# Patient Record
Sex: Female | Born: 1985
Health system: Southern US, Community
[De-identification: ages and names within clinical notes are randomized; demographics above are authoritative.]

## PROBLEM LIST (undated history)

## (undated) DIAGNOSIS — A6000 Herpesviral infection of urogenital system, unspecified: Secondary | ICD-10-CM

## (undated) DIAGNOSIS — R87612 Low grade squamous intraepithelial lesion on cytologic smear of cervix (LGSIL): Secondary | ICD-10-CM

## (undated) DIAGNOSIS — N3090 Cystitis, unspecified without hematuria: Secondary | ICD-10-CM

## (undated) DIAGNOSIS — G43909 Migraine, unspecified, not intractable, without status migrainosus: Secondary | ICD-10-CM

## (undated) HISTORY — DX: Low grade squamous intraepithelial lesion on cytologic smear of cervix (LGSIL): R87.612

## (undated) HISTORY — DX: Herpesviral infection of urogenital system, unspecified: A60.00

## (undated) HISTORY — PX: MOUTH SURGERY: SHX715

## (undated) HISTORY — DX: Migraine, unspecified, not intractable, without status migrainosus: G43.909

## (undated) HISTORY — DX: Cystitis, unspecified without hematuria: N30.90

## (undated) HISTORY — PX: HIP SURGERY: SHX245

---

## 2007-06-30 ENCOUNTER — Ambulatory Visit: Payer: Self-pay | Admitting: Urology

## 2007-07-01 ENCOUNTER — Ambulatory Visit: Payer: Self-pay | Admitting: Urology

## 2008-08-29 IMAGING — US US RENAL KIDNEY
1 series · 17 of 23 positions shown · non-contrast
Comparison: none

REASON FOR EXAM: Chronic Cystitis
COMMENTS:

[Series 1: us renal kidney · 17 of 23 slices shown]
[im 1/23]
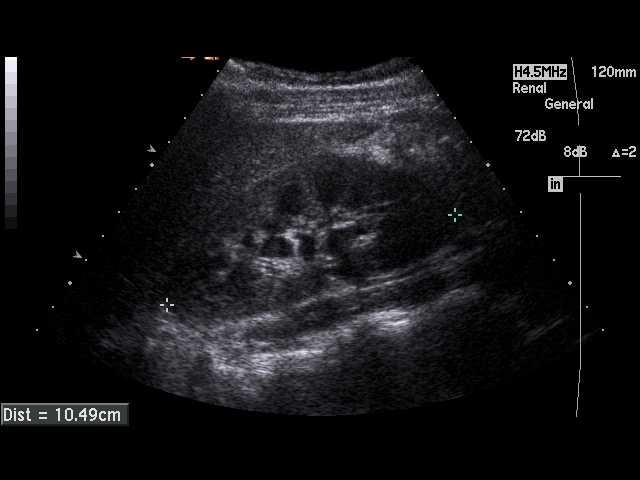
[im 3/23]
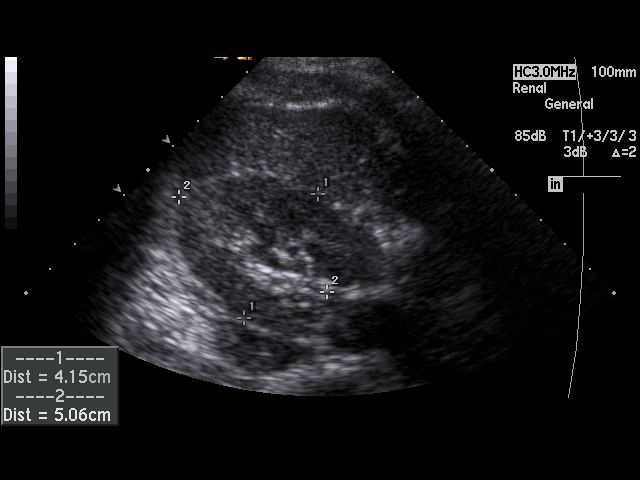
[im 4/23]
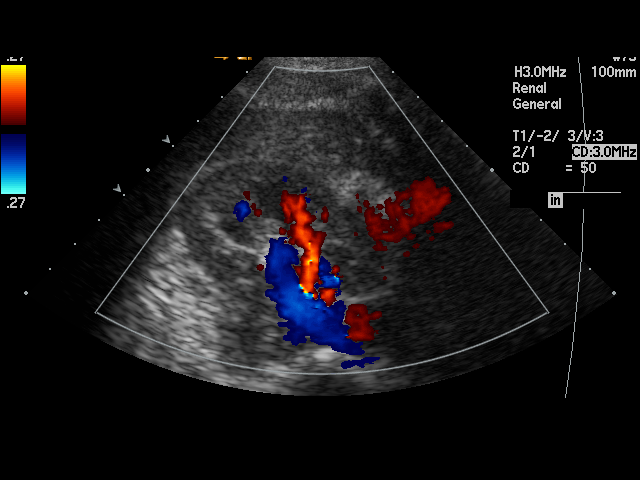
[im 5/23]
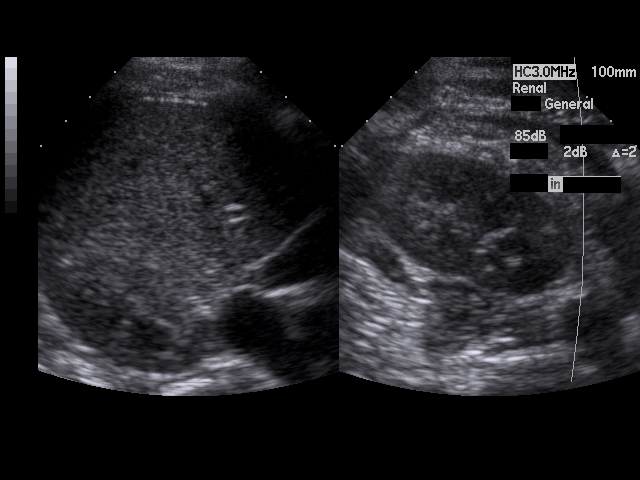
[im 7/23]
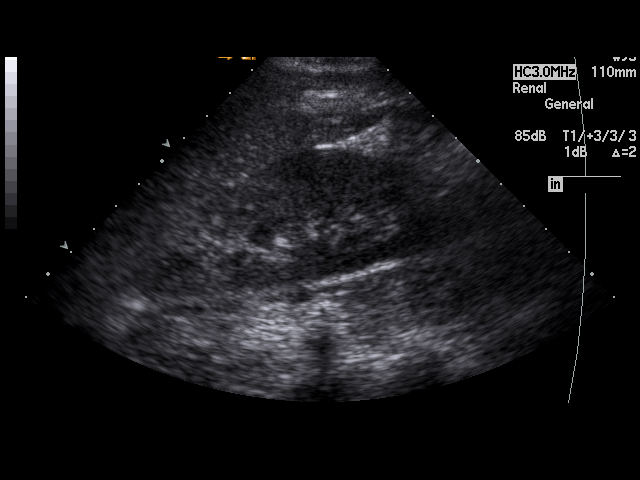
[im 8/23]
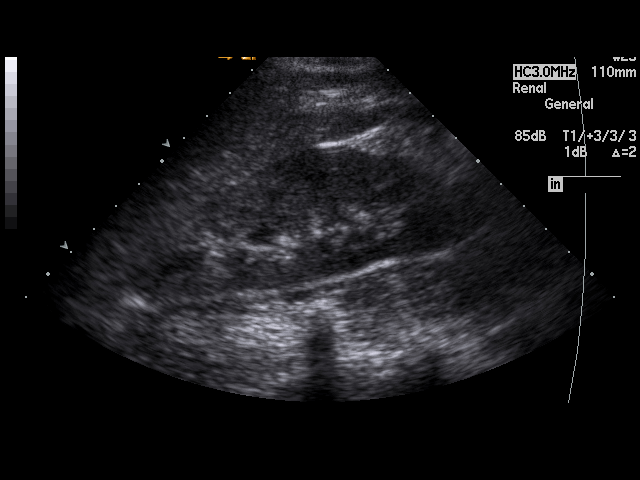
[im 9/23]
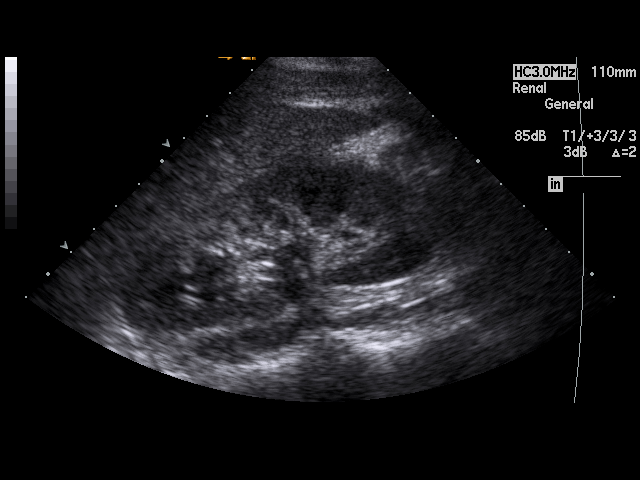
[im 11/23]
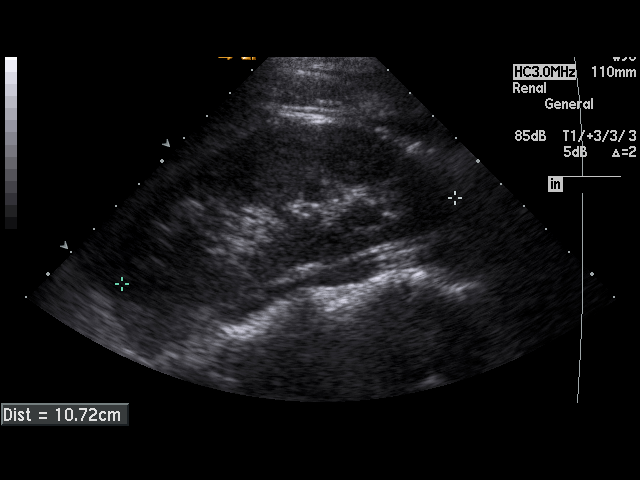
[im 12/23]
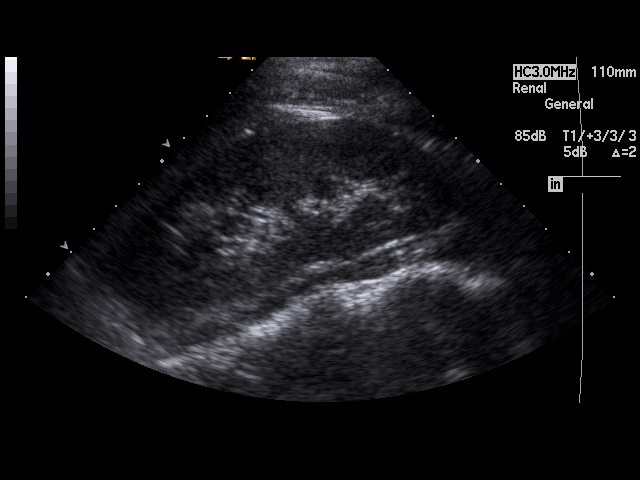
[im 13/23]
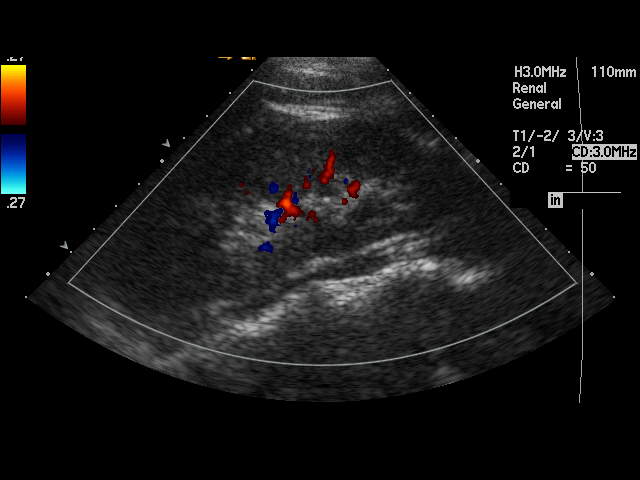
[im 15/23]
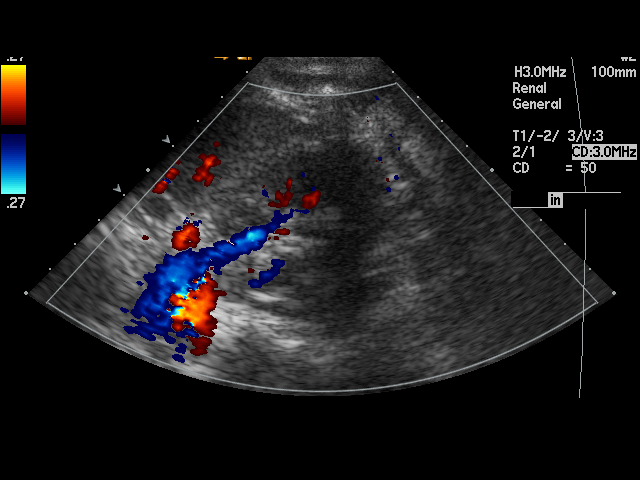
[im 16/23]
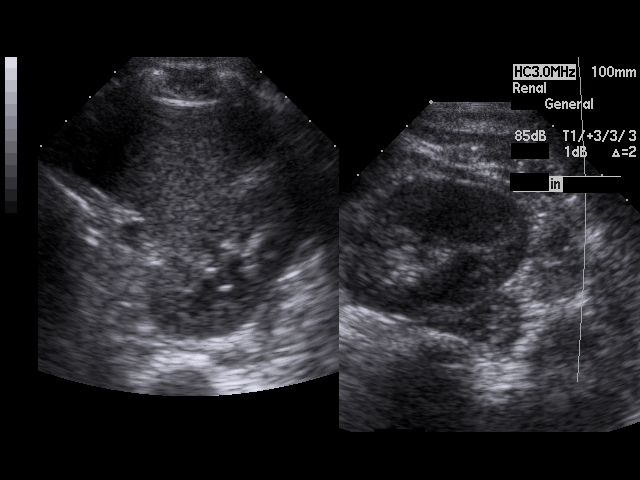
[im 17/23]
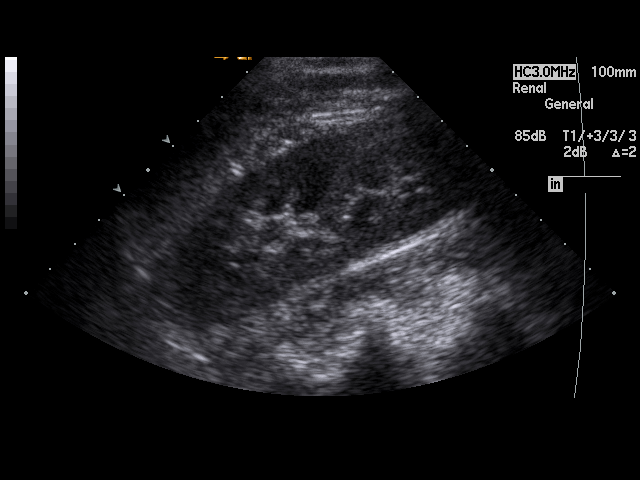
[im 19/23]
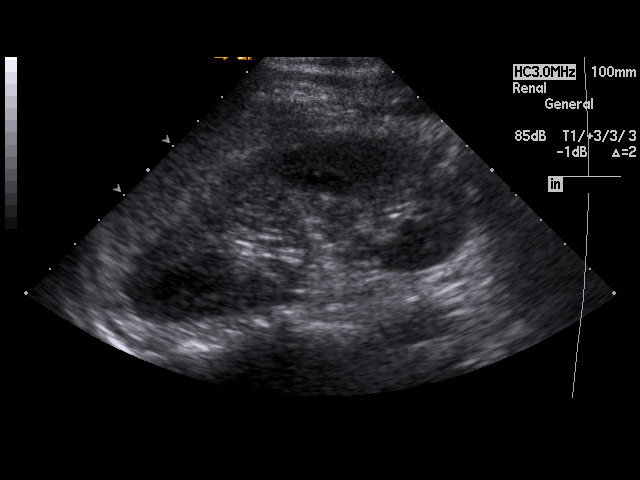
[im 20/23]
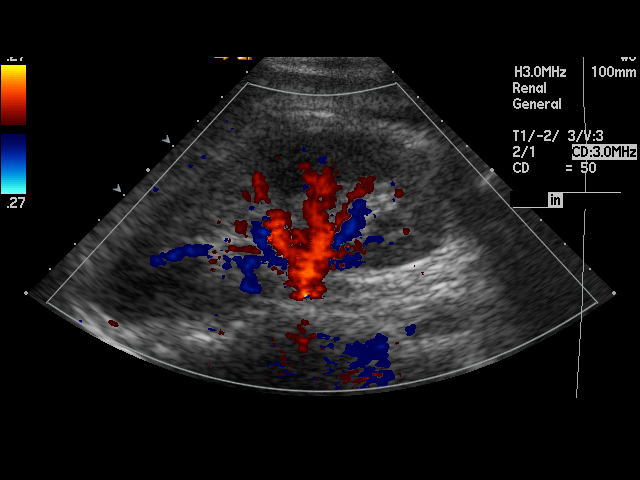
[im 21/23]
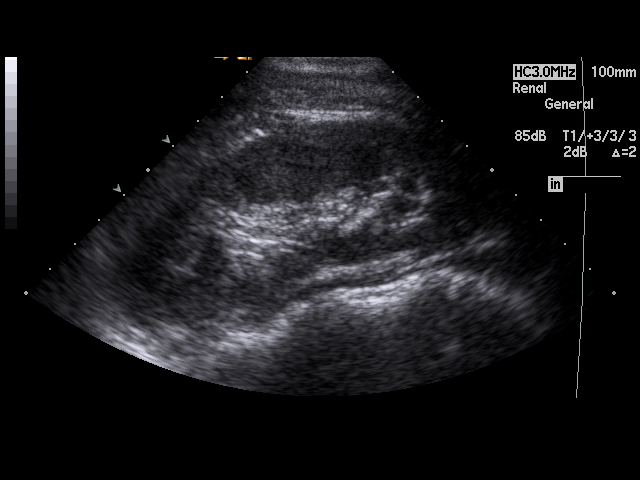
[im 23/23]
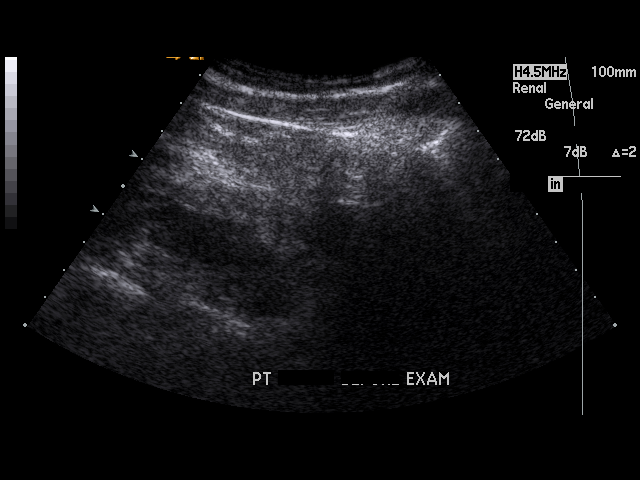

[17 of 23 positions shown; findings below may reference images not displayed]

PROCEDURE:     US  - US KIDNEY  - June 30, 2007 [DATE]

RESULT:     The RIGHT kidney measures 10.49 cm x 4.15 cm x 5.06 cm, and the
LEFT kidney measures 10.72 cm x 3.77 cm x 4.48 cm.  The renal cortical
margins appear smooth. No renal mass lesions are seen. No renal
calcifications are identified. There is no hydronephrosis. The urinary
bladder is empty and therefore not visualized sufficiently for evaluation on
this exam.
IMPRESSION: No significant renal abnormalities are identified.

## 2008-08-29 IMAGING — CR DG ABDOMEN 1V
1 series · 2 of 2 positions shown · non-contrast
Comparison: none

REASON FOR EXAM: [DATE] Other Chronic Cystitis-SEND FILMS WITH PATIENT
COMMENTS:

[Series 1: view not recorded · 0.17mm/px · 2 of 2 slices shown]
[im 1/2]
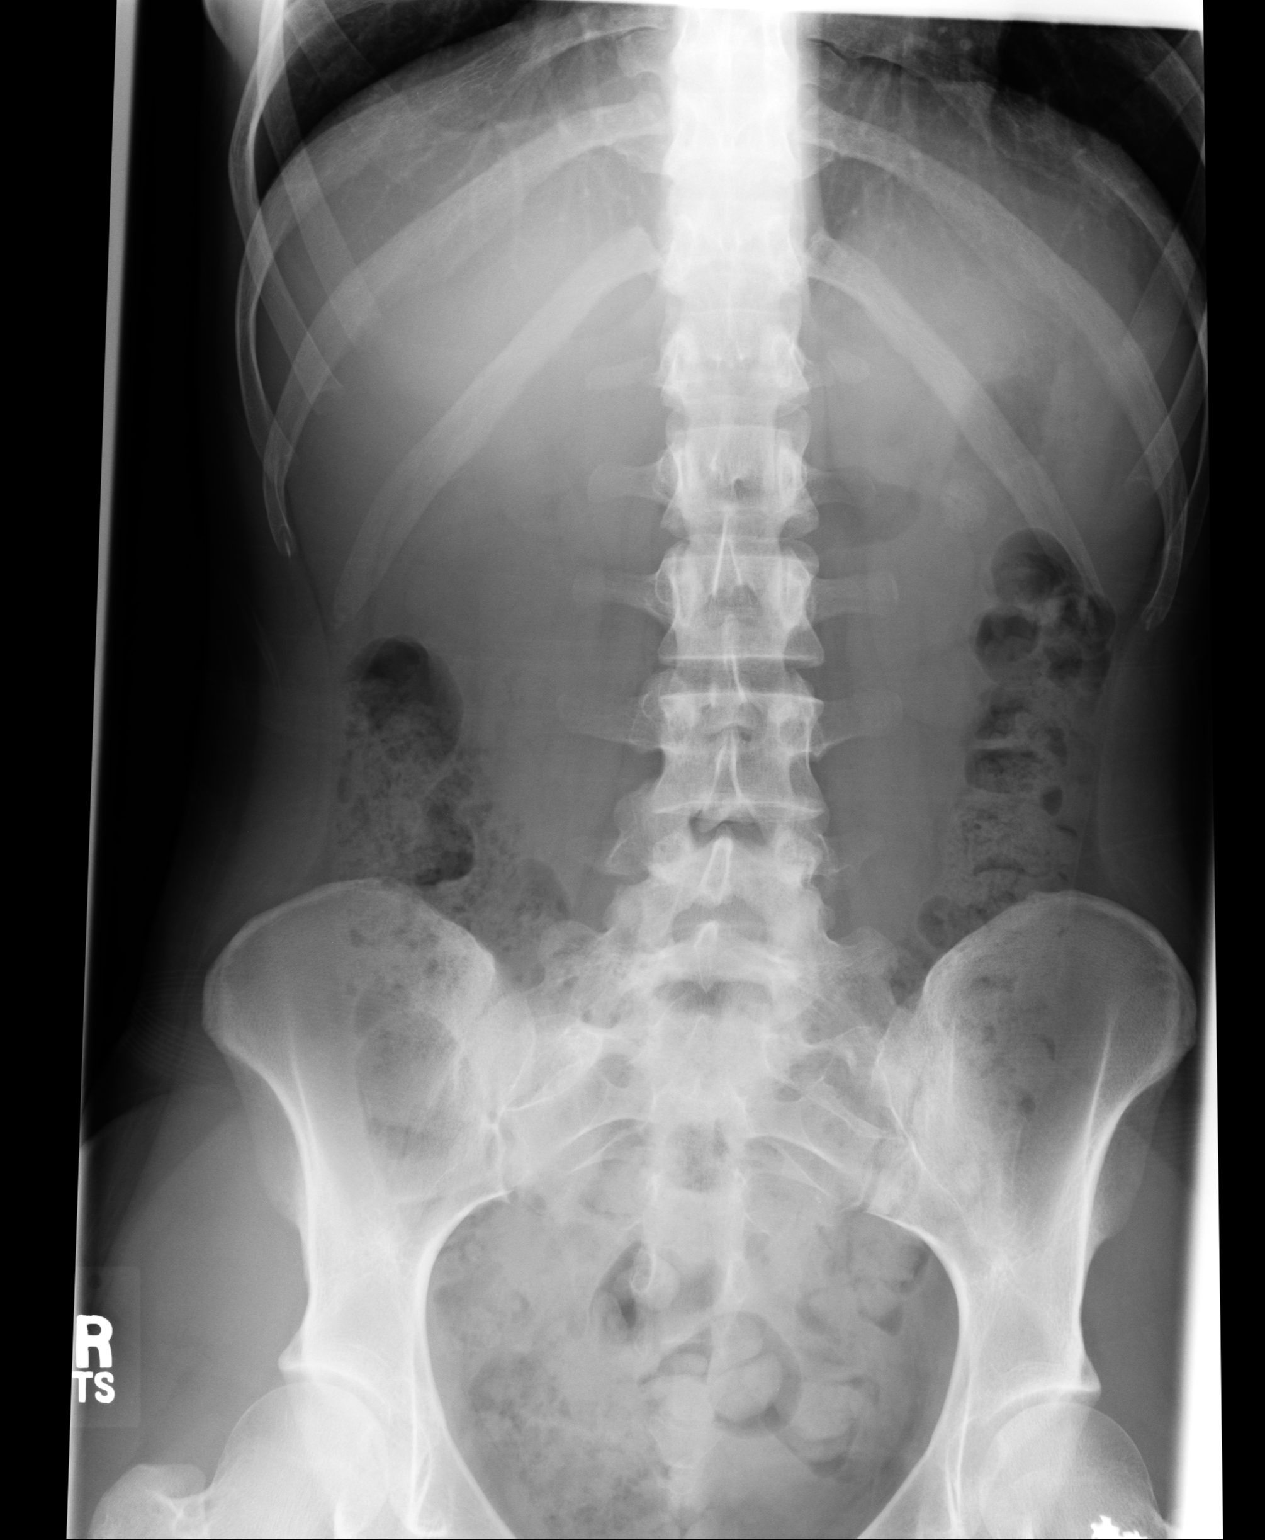
[im 2/2]
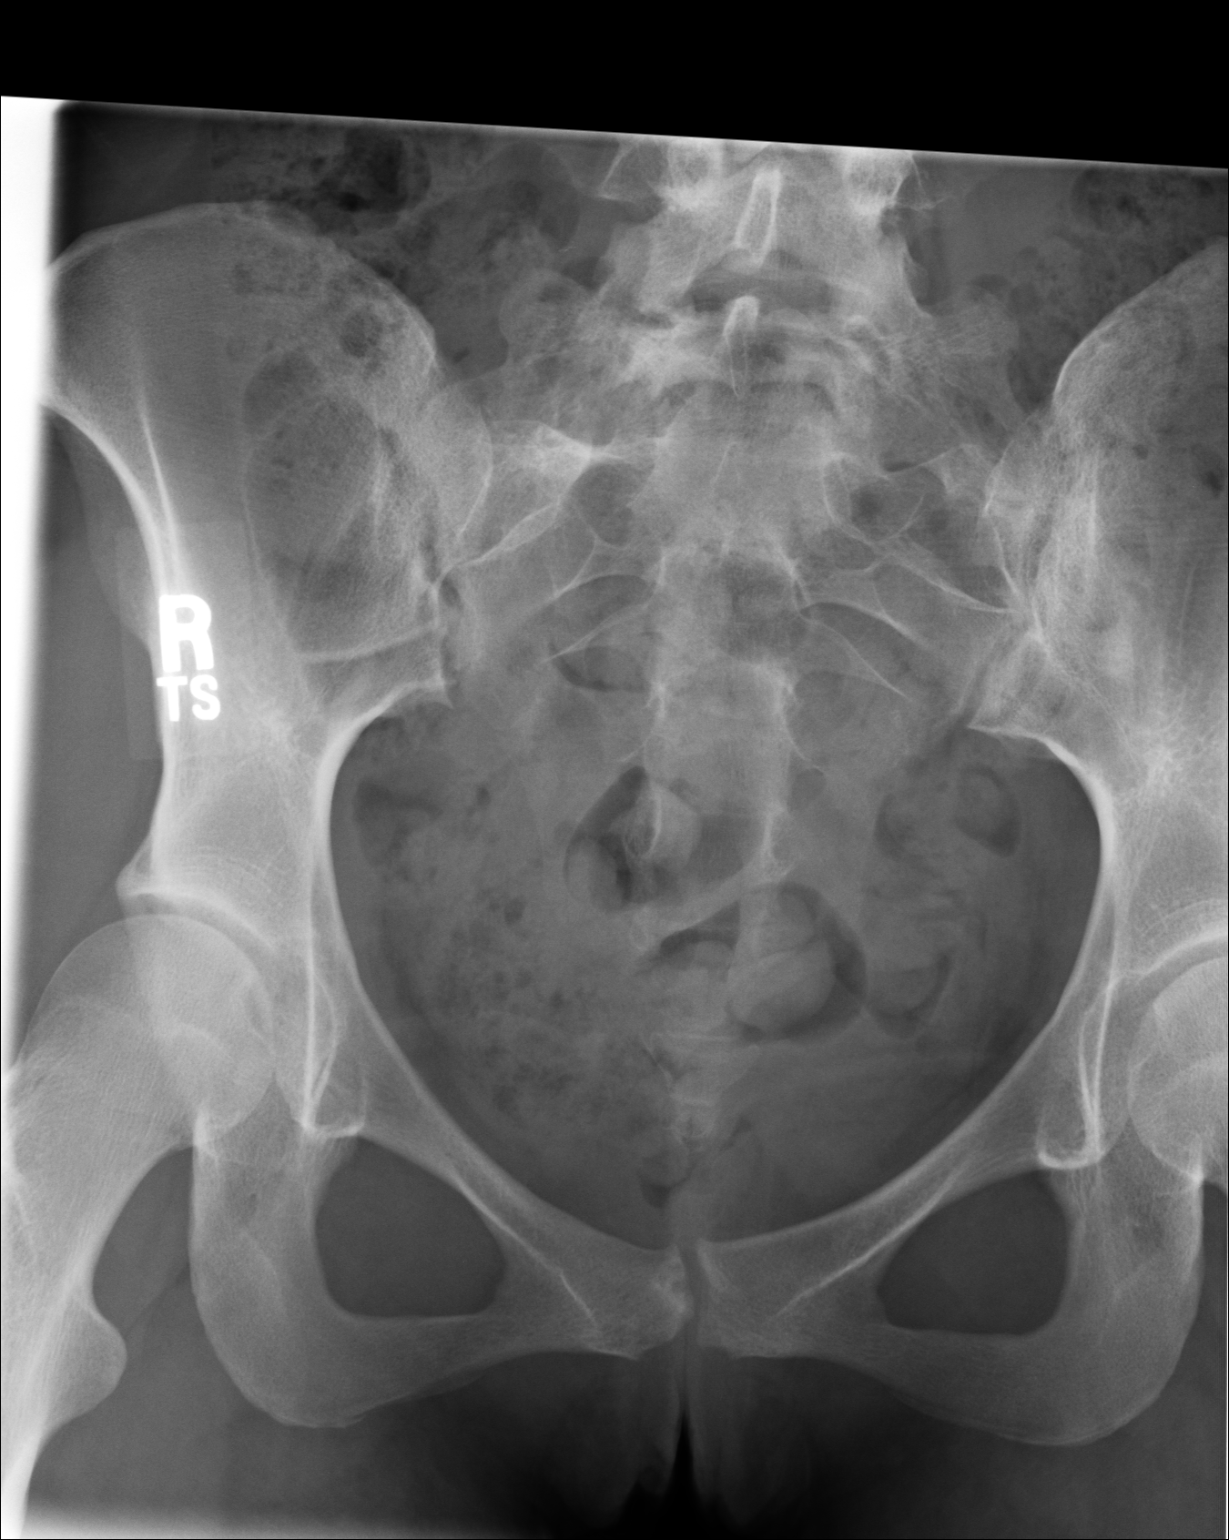

[2 of 2 positions shown; findings below may reference images not displayed]

PROCEDURE:     DXR - DXR KIDNEY URETER BLADDER  - June 30, 2007  [DATE]

RESULT:     There is a faint concentric 1.6 cm density projected over the
mid pole region of the LEFT kidney. This apparently represents artifact
secondary to opaque material in the stomach. No definite renal or ureteral
calcifications are seen. There is a moderate amount of fecal material in the
colon.
IMPRESSION: 1. No definite renal or ureteral calcifications are identified.
2. The density projected over the LEFT kidney is thought to represent opaque
material in the GI tract.

## 2010-06-09 DIAGNOSIS — A6000 Herpesviral infection of urogenital system, unspecified: Secondary | ICD-10-CM

## 2010-06-09 HISTORY — DX: Herpesviral infection of urogenital system, unspecified: A60.00

## 2012-03-10 DIAGNOSIS — R87612 Low grade squamous intraepithelial lesion on cytologic smear of cervix (LGSIL): Secondary | ICD-10-CM

## 2012-03-10 HISTORY — DX: Low grade squamous intraepithelial lesion on cytologic smear of cervix (LGSIL): R87.612

## 2015-02-04 ENCOUNTER — Encounter: Payer: Self-pay | Admitting: Gynecology

## 2015-02-04 ENCOUNTER — Ambulatory Visit
Admission: EM | Admit: 2015-02-04 | Discharge: 2015-02-04 | Disposition: A | Discharge status: Home / Self Care | Payer: BLUE CROSS/BLUE SHIELD | Attending: Family Medicine | Admitting: Family Medicine

## 2015-02-04 DIAGNOSIS — N390 Urinary tract infection, site not specified: Secondary | ICD-10-CM | POA: Diagnosis not present

## 2015-02-04 LAB — URINALYSIS COMPLETE WITH MICROSCOPIC (ARMC ONLY)
BILIRUBIN URINE: NEGATIVE
Glucose, UA: NEGATIVE mg/dL
HGB URINE DIPSTICK: NEGATIVE
KETONES UR: NEGATIVE mg/dL
Nitrite: NEGATIVE
PH: 6 (ref 5.0–8.0)
Protein, ur: NEGATIVE mg/dL
Specific Gravity, Urine: 1.025 (ref 1.005–1.030)

## 2015-02-04 LAB — PREGNANCY, URINE: Preg Test, Ur: NEGATIVE

## 2015-02-04 MED ORDER — SULFAMETHOXAZOLE-TRIMETHOPRIM 800-160 MG PO TABS: 1.0000 | ORAL_TABLET | Freq: Two times a day (BID) | ORAL | Status: AC

## 2015-02-04 MED ORDER — PHENAZOPYRIDINE HCL 100 MG PO TABS: 200.0000 mg | ORAL_TABLET | Freq: Three times a day (TID) | ORAL | Status: DC | PRN

## 2015-02-04 NOTE — ED Notes (Signed)
Patient c/o burning / discomfort / foul odor x 5 days.

## 2015-02-04 NOTE — Discharge Instructions (Signed)
To medication as prescribed. Rest. Plenty of fluids. Continue to void post intercourse.  Follow-up with her primary care physician as needed. Return to urgent care as needed for new or worsening concerns.  Urinary Tract Infection A urinary tract infection (UTI) can occur any place along the urinary tract. The tract includes the kidneys, ureters, bladder, and urethra. A type of germ called bacteria often causes a UTI. UTIs are often helped with antibiotic medicine.  HOME CARE   If given, take antibiotics as told by your doctor. Finish them even if you start to feel better.  Drink enough fluids to keep your pee (urine) clear or pale yellow.  Avoid tea, drinks with caffeine, and bubbly (carbonated) drinks.  Pee often. Avoid holding your pee in for a long time.  Pee before and after having sex (intercourse).  Wipe from front to back after you poop (bowel movement) if you are a woman. Use each tissue only once. GET HELP RIGHT AWAY IF:   You have back pain.  You have lower belly (abdominal) pain.  You have chills.  You feel sick to your stomach (nauseous).  You throw up (vomit).  Your burning or discomfort with peeing does not go away.  You have a fever.  Your symptoms are not better in 3 days. MAKE SURE YOU:   Understand these instructions.  Will watch your condition.  Will get help right away if you are not doing well or get worse.   This information is not intended to replace advice given to you by your health care provider. Make sure you discuss any questions you have with your health care provider.   Document Released: 08/13/2007 Document Revised: 03/17/2014 Document Reviewed: 09/25/2011 Elsevier Interactive Patient Education [employer]2016 Elsevier Inc.

## 2015-02-04 NOTE — ED Provider Notes (Signed)
[facility]  ____________________________________________  Time seen: Approximately 3:37 PM  I have reviewed the triage vital signs and the nursing notes.   HISTORY  Chief Complaint Urinary Tract Infection   HPI Julie Christian is a 29 y.o. female presents for the complaints of 5 days of noticing a foul urine smell as well as some burning with urination. Patient reports continues to eat and drink well. Denies bowel changes. Reports history of similar with urinary tract infections. States last urinary tract infection was 2-3 years ago. Denies concerns of STDs. States one sexual partner. Denies vaginal pain, vaginal discharge, vaginal odor or vaginal complaints. Denies fever, back pain, abdominal pain, nausea, vomiting.   History reviewed. No pertinent past medical history.  There are no active problems to display for this patient.   Past Surgical History  Procedure Laterality Date  . Hip surgery Left   . Mouth surgery      Current Outpatient Rx  Name  Route  Sig  Dispense  Refill  . acyclovir (ZOVIRAX) 200 MG capsule   Oral   Take 200 mg by mouth 5 (five) times daily.         . norethindrone-ethinyl estradiol (JUNEL FE,GILDESS FE,LOESTRIN FE) 1-20 MG-MCG tablet   Oral   Take 1 tablet by mouth daily.           Allergies Vicodin  No family history on file.  Social History Social History  Substance Use Topics  . Smoking status: Never Smoker   . Smokeless tobacco: None  . Alcohol Use: Yes    Review of Systems Constitutional: No fever/chills Eyes: No visual changes. ENT: No sore throat. Cardiovascular: Denies chest pain. Respiratory: Denies shortness of breath. Gastrointestinal: No abdominal pain.  No nausea, no vomiting.  No diarrhea.  No constipation. Genitourinary: Positive for dysuria. Musculoskeletal: Negative for back pain. Skin: Negative for rash. Neurological: Negative for headaches, focal weakness or numbness.  10-point ROS otherwise  negative.  ____________________________________________   PHYSICAL EXAM:  VITAL SIGNS: ED Triage Vitals  Enc Vitals Group     BP --      Pulse --      Resp --      Temp --      Temp src --      SpO2 --      Weight --      Height --      Head Cir --      Peak Flow --      Pain Score 02/04/15 1531 6     Pain Loc --      Pain Edu? --      Excl. in GC? --    Today's Vitals   02/04/15 1531 02/04/15 1532  BP:  96/60  Pulse:  90  Temp:  98.8 F (37.1 C)  TempSrc:  Oral  Resp:  16  Height:  5\' 3"  (1.6 m)  Weight:  112 lb (50.803 kg)  SpO2:  100%  PainSc: 6       Constitutional: Alert and oriented. Well appearing and in no acute distress. Eyes: Conjunctivae are normal. PERRL. EOMI. Head: Atraumatic.  Nose: No congestion/rhinnorhea.  Mouth/Throat: Mucous membranes are moist.  Oropharynx non-erythematous. Neck: No stridor.  No cervical spine tenderness to palpation. Hematological/Lymphatic/Immunilogical: No cervical lymphadenopathy. Cardiovascular: Normal rate, regular rhythm. Grossly normal heart sounds.  Good peripheral circulation. Respiratory: Normal respiratory effort.  No retractions. Lungs CTAB. No wheezes, rales or rhonchi. Good air movement. Gastrointestinal: Soft and nontender. No  distention. Normal Bowel sounds.  No CVA tenderness. Musculoskeletal: No lower or upper extremity tenderness nor edema.  No joint effusions. Bilateral pedal pulses equal and easily palpated.  Neurologic:  Normal speech and language. No gross focal neurologic deficits are appreciated. No gait instability. Skin:  Skin is warm, dry and intact. No rash noted. Psychiatric: Mood and affect are normal. Speech and behavior are normal.  ____________________________________________   LABS (all labs ordered are listed, but only abnormal results are displayed)  Labs Reviewed  URINALYSIS COMPLETEWITH MICROSCOPIC ([facility] ONLY) - Abnormal; Notable for the following:    Leukocytes, UA TRACE (*)     Bacteria, UA RARE (*)    Squamous Epithelial / LPF 0-5 (*)    All other components within normal limits  URINE CULTURE  PREGNANCY, URINE     INITIAL IMPRESSION / ASSESSMENT AND PLAN / ED COURSE  Pertinent labs & imaging results that were available during my care of the patient were reviewed by me and considered in my medical decision making (see chart for details).  Very well-appearing patient. No acute distress. Presents for the complaints of 4-5 days of dysuria as well as with foul-smelling urine. Denies vaginal complaints. Urine pregnancy negative. Urine positive for rare bacteria, trace leukocytes, will culture urine. Suspect UTI. Will treat urinary tract infection with oral Bactrim 3 days as well as Pyridium as needed. Encourage fluids,  as well as discussed and encouraged avoiding post intercourse. Patient follow-up with primary care physician. Patient reports that she had to leave work early today and work note given.  Discussed follow up with Primary care physician this week. Discussed follow up and return parameters including no resolution or any worsening concerns. Patient verbalized understanding and agreed to plan.   ____________________________________________   FINAL CLINICAL IMPRESSION(S) / ED DIAGNOSES  Final diagnoses:  UTI (lower urinary tract infection)       Renford Dills, NP 02/04/15 1629

## 2015-02-06 LAB — URINE CULTURE: SPECIAL REQUESTS: NORMAL

## 2016-12-15 ENCOUNTER — Encounter: Payer: Self-pay | Admitting: Obstetrics and Gynecology

## 2016-12-15 ENCOUNTER — Ambulatory Visit (INDEPENDENT_AMBULATORY_CARE_PROVIDER_SITE_OTHER): Payer: No Typology Code available for payment source | Admitting: Obstetrics and Gynecology

## 2016-12-15 VITALS — BP 124/66 | HR 124 | Ht 63.0 in | Wt 116.0 lb

## 2016-12-15 DIAGNOSIS — Z124 Encounter for screening for malignant neoplasm of cervix: Secondary | ICD-10-CM | POA: Diagnosis not present

## 2016-12-15 DIAGNOSIS — A6004 Herpesviral vulvovaginitis: Secondary | ICD-10-CM | POA: Diagnosis not present

## 2016-12-15 DIAGNOSIS — Z01419 Encounter for gynecological examination (general) (routine) without abnormal findings: Secondary | ICD-10-CM | POA: Diagnosis not present

## 2016-12-15 DIAGNOSIS — G43119 Migraine with aura, intractable, without status migrainosus: Secondary | ICD-10-CM

## 2016-12-15 DIAGNOSIS — Z3041 Encounter for surveillance of contraceptive pills: Secondary | ICD-10-CM | POA: Diagnosis not present

## 2016-12-15 MED ORDER — ACYCLOVIR 800 MG PO TABS
800.0000 mg | ORAL_TABLET | Freq: Two times a day (BID) | ORAL | 0 refills | Status: DC
Start: 2016-12-15 — End: 2016-12-22

## 2016-12-15 MED ORDER — NORETHIN ACE-ETH ESTRAD-FE 1-20 MG-MCG PO TABS: 1.0000 | ORAL_TABLET | Freq: Every day | ORAL | 12 refills | Status: DC

## 2016-12-15 NOTE — Progress Notes (Signed)
PCP:  Patient, No Pcp Per   Chief Complaint  Patient presents with  . Gynecologic Exam     HPI:      Julie Christian is a 31 y.o. No obstetric history on file. who LMP was Patient's last menstrual period was 11/30/2016., presents today for her annual examination.  Her menses are regular every 28-30 days, lasting 3 days.  Dysmenorrhea mild, occurring first 1-2 days of flow. She does not have intermenstrual bleeding.  Sex activity: not sexually active.  Last Pap: December 12, 2015  Results were: no abnormalities  Hx of STDs: HSV, HPV.Takes acyclovir prn HSV outbreaks; no recent sx.   There is no FH of breast cancer. There is no FH of ovarian cancer. The patient does do self-breast exams.  Tobacco use: The patient denies current or previous tobacco use. Alcohol use: social drinker No drug use.  Exercise: moderately active  She does get adequate calcium and Vitamin D in her diet.   Past Medical History:  Diagnosis Date  . Migraines     Past Surgical History:  Procedure Laterality Date  . HIP SURGERY Left   . MOUTH SURGERY      History reviewed. No pertinent family history.  Social History   Social History  . Marital status: Single    Spouse name: N/A  . Number of children: N/A  . Years of education: N/A   Occupational History  . Not on file.   Social History Main Topics  . Smoking status: Never Smoker  . Smokeless tobacco: Never Used  . Alcohol use Yes  . Drug use: No  . Sexual activity: Not Currently    Partners: Male    Birth control/ protection: Pill   Other Topics Concern  . Not on file   Social History Narrative  . No narrative on file    Current Meds  Medication Sig  . acyclovir (ZOVIRAX) 800 MG tablet Take 1 tablet (800 mg total) by mouth 2 (two) times daily. For 5 days prn sx  . norethindrone-ethinyl estradiol (JUNEL FE,GILDESS FE,LOESTRIN FE) 1-20 MG-MCG tablet Take 1 tablet by mouth daily.  . [DISCONTINUED] acyclovir (ZOVIRAX) 200 MG  capsule Take 200 mg by mouth 5 (five) times daily.  . [DISCONTINUED] acyclovir (ZOVIRAX) 800 MG tablet Take 800 mg by mouth 2 (two) times daily.  . [DISCONTINUED] norethindrone-ethinyl estradiol (JUNEL FE,GILDESS FE,LOESTRIN FE) 1-20 MG-MCG tablet Take 1 tablet by mouth daily.     ROS:  Review of Systems  Constitutional: Negative for fatigue, fever and unexpected weight change.  Respiratory: Negative for cough, shortness of breath and wheezing.   Cardiovascular: Negative for chest pain, palpitations and leg swelling.  Gastrointestinal: Negative for blood in stool, constipation, diarrhea, nausea and vomiting.  Endocrine: Negative for cold intolerance, heat intolerance and polyuria.  Genitourinary: Negative for dyspareunia, dysuria, flank pain, frequency, genital sores, hematuria, menstrual problem, pelvic pain, urgency, vaginal bleeding, vaginal discharge and vaginal pain.  Musculoskeletal: Negative for back pain, joint swelling and myalgias.  Skin: Negative for rash.  Neurological: Negative for dizziness, syncope, light-headedness, numbness and headaches.  Hematological: Negative for adenopathy.  Psychiatric/Behavioral: Negative for agitation, confusion, sleep disturbance and suicidal ideas. The patient is not nervous/anxious.      Objective: BP 124/66 (BP Location: Left Arm, Patient Position: Sitting, Cuff Size: Normal)   Pulse (!) 124   Ht  (1.6 m)   Wt 116 lb (52.6 kg)   LMP 11/30/2016   BMI 20.55 kg/m  Physical Exam  Constitutional: She is oriented to person, place, and time. She appears well-developed and well-nourished.  Genitourinary: Vagina normal and uterus normal. There is no rash or tenderness on the right labia. There is no rash or tenderness on the left labia. No erythema or tenderness in the vagina. No vaginal discharge found. Right adnexum does not display mass and does not display tenderness. Left adnexum does not display mass and does not display tenderness.  Cervix does not exhibit motion tenderness or polyp. Uterus is not enlarged or tender.  Neck: Normal range of motion. No thyromegaly present.  Cardiovascular: Normal rate, regular rhythm and normal heart sounds.   No murmur heard. Pulmonary/Chest: Effort normal and breath sounds normal. Right breast exhibits no mass, no nipple discharge, no skin change and no tenderness. Left breast exhibits no mass, no nipple discharge, no skin change and no tenderness.  Abdominal: Soft. There is no tenderness. There is no guarding.  Musculoskeletal: Normal range of motion.  Neurological: She is alert and oriented to person, place, and time. No cranial nerve deficit.  Psychiatric: She has a normal mood and affect. Her behavior is normal.  Vitals reviewed.   Assessment/Plan: Encounter for annual routine gynecological examination  Cervical cancer screening - Plan: IGP, rfx Aptima HPV ASCU  Encounter for surveillance of contraceptive pills - Pt really likes current pills. Hx of rare migraines with aura. Prog only methods discussed. F/u if headaches increase in frequency. Rx RF OCP for now. - Plan: norethindrone-ethinyl estradiol (JUNEL FE,GILDESS FE,LOESTRIN FE) 1-20 MG-MCG tablet  Herpes simplex vulvovaginitis - Rx RF acyclovir prn outbreaks. - Plan: acyclovir (ZOVIRAX) 800 MG tablet  Intractable migraine with aura without status migrainosus - Rare, ~once a yr. Will cont to follow sx. If persist, will change to prog only BC. Pt aware of risk of stroke with migraines with aura and DVTs with estrogen  Meds ordered this encounter  Medications  . DISCONTD: acyclovir (ZOVIRAX) 800 MG tablet    Sig: Take 800 mg by mouth 2 (two) times daily.  . norethindrone-ethinyl estradiol (JUNEL FE,GILDESS FE,LOESTRIN FE) 1-20 MG-MCG tablet    Sig: Take 1 tablet by mouth daily.    Dispense:  1 Package    Refill:  12  . acyclovir (ZOVIRAX) 800 MG tablet    Sig: Take 1 tablet (800 mg total) by mouth 2 (two) times daily. For  5 days prn sx    Dispense:  30 tablet    Refill:  0             GYN counsel use and side effects of OCP's, adequate intake of calcium and vitamin D, diet and exercise     F/U  Return in about 1 year (around 12/15/2017).  Alicia B. Copland, PA-C 12/15/2016 3:17 PM

## 2016-12-19 LAB — IGP, RFX APTIMA HPV ASCU: PAP Smear Comment: 0

## 2016-12-19 LAB — HPV APTIMA: HPV APTIMA: POSITIVE — AB

## 2016-12-22 ENCOUNTER — Other Ambulatory Visit: Payer: Self-pay | Admitting: Obstetrics and Gynecology

## 2016-12-22 ENCOUNTER — Telehealth: Payer: Self-pay

## 2016-12-22 ENCOUNTER — Encounter: Payer: Self-pay | Admitting: Obstetrics and Gynecology

## 2016-12-22 DIAGNOSIS — Z3041 Encounter for surveillance of contraceptive pills: Secondary | ICD-10-CM

## 2016-12-22 DIAGNOSIS — A6004 Herpesviral vulvovaginitis: Secondary | ICD-10-CM

## 2016-12-22 MED ORDER — ACYCLOVIR 400 MG PO TABS: 800.0000 mg | ORAL_TABLET | Freq: Two times a day (BID) | ORAL | 1 refills | Status: DC

## 2016-12-22 MED ORDER — FLUCONAZOLE 150 MG PO TABS
150.0000 mg | ORAL_TABLET | Freq: Once | ORAL | 0 refills | Status: AC
Start: 2016-12-22 — End: 2016-12-22

## 2016-12-22 MED ORDER — NORETHINDRONE ACET-ETHINYL EST 1.5-30 MG-MCG PO TABS: 1.0000 | ORAL_TABLET | Freq: Every day | ORAL | 11 refills | Status: DC

## 2016-12-22 NOTE — Telephone Encounter (Signed)
Pt went to p/u rx after appt 10/8 to discover both had been changed.  Pt confused and doesn't want the changes.  One was changed from  to  and bc had changed as well.  She doesn't want it changed b/c her body is adapted to it and it wasn't discussed at annual.  Please call and let her know what's going on.  [phone]

## 2016-12-22 NOTE — Progress Notes (Signed)
Junel Rx dose change (wrong Rx in Med list).  Acyclovir dose change to 400 mg from 800 mg.

## 2016-12-22 NOTE — Progress Notes (Signed)
Rx diflucan for yeast on pap 

## 2016-12-22 NOTE — Telephone Encounter (Addendum)
Rxs already fixed. Pt notified.

## 2016-12-24 ENCOUNTER — Telehealth: Payer: Self-pay

## 2016-12-24 NOTE — Telephone Encounter (Signed)
Pt would like to finish conversation you and she were having Monday.  Please call [phone].

## 2016-12-24 NOTE — Telephone Encounter (Signed)
Done

## 2017-01-01 ENCOUNTER — Other Ambulatory Visit: Payer: Self-pay | Admitting: Obstetrics and Gynecology

## 2017-01-01 ENCOUNTER — Telehealth: Payer: Self-pay

## 2017-01-01 MED ORDER — NITROFURANTOIN MONOHYD MACRO 100 MG PO CAPS: 100.0000 mg | ORAL_CAPSULE | Freq: Two times a day (BID) | ORAL | 0 refills | Status: AC

## 2017-01-01 NOTE — Telephone Encounter (Signed)
Lm with pt

## 2017-01-01 NOTE — Telephone Encounter (Signed)
Pt is calling stating that she is having UTI symptoms and that [facility] has called her in something before for this issue. When she came on 10/8, her bill is over $500 bc of ins. Please let pt know if something will be called in.

## 2017-01-01 NOTE — Progress Notes (Signed)
Rx for UTI sx. F/u if doesn't resolve.

## 2017-01-01 NOTE — Telephone Encounter (Signed)
RN to notify pt Rx macrobid eRxd. F/u if sx persist. FYI, she received EOB of $500. Since it was annual and preventive, her insurance will pay for it. She shouldn't get bill from us. Let us know if so. RN to discuss with pt.

## 2017-01-27 ENCOUNTER — Ambulatory Visit: Payer: Self-pay | Admitting: Obstetrics and Gynecology

## 2017-02-10 ENCOUNTER — Ambulatory Visit: Payer: Self-pay | Admitting: Obstetrics and Gynecology

## 2017-03-19 ENCOUNTER — Ambulatory Visit: Payer: BLUE CROSS/BLUE SHIELD | Admitting: Obstetrics and Gynecology

## 2017-03-19 VITALS — BP 110/88 | HR 77 | Ht 63.0 in | Wt 116.0 lb

## 2017-03-19 DIAGNOSIS — R87610 Atypical squamous cells of undetermined significance on cytologic smear of cervix (ASC-US): Secondary | ICD-10-CM | POA: Diagnosis not present

## 2017-03-19 DIAGNOSIS — R87810 Cervical high risk human papillomavirus (HPV) DNA test positive: Secondary | ICD-10-CM

## 2017-03-19 NOTE — Progress Notes (Signed)
   GYNECOLOGY CLINIC COLPOSCOPY PROCEDURE NOTE  32 y.o. G0P0000 here for colposcopy for ASCUS HPV positive pap smear on 12/15/2016. Discussed underlying role for HPV infection in the development of cervical dysplasia, its natural history and progression/regression, need for surveillance.  Is the patient  pregnant: No Currently on Junel LMP: Patient's last menstrual period was 02/07/2017. Smoking status:  Metrics: Intervention Frequency ACO  Documented Smoking Status Yearly  Screened one or more times in 24 months  Cessation Counseling or  Active cessation medication Past 24 months  Past 24 months   Guideline developer: UpToDate (See UpToDate for funding source) Date Released: 2014   Contraception: OCP High risk partner:No History of STD:  No Future fertility desired:  Yes  Patient given informed consent, signed copy in the chart, time out was performed.  The patient was position in dorsal lithotomy position. Speculum was placed the cervix was visualized.   After application of acetic acid colposcopic inspection of the cervix was undertaken.   Colposcopy adequate, full visualization of transformation zone: Yes Minor acetowhite changes at 11 O'Clock, normal appearing nulliparous cervix slightly retroverted; corresponding biopsies obtained.   ECC specimen obtained:  Yes  All specimens were labeled and sent to pathology.   Patient was given post procedure instructions.  Will follow up pathology and manage accordingly.  Routine preventative health maintenance measures emphasized.  OBGyn Exam  Vena AustriaAndreas Lorretta Kerce, MD, Merlinda FrederickFACOG [facility], [facility]

## 2017-03-23 LAB — PATHOLOGY

## 2017-11-30 ENCOUNTER — Telehealth: Payer: Self-pay

## 2017-11-30 ENCOUNTER — Ambulatory Visit: Payer: BLUE CROSS/BLUE SHIELD | Admitting: Obstetrics and Gynecology

## 2017-11-30 NOTE — Telephone Encounter (Signed)
Can you help?

## 2017-11-30 NOTE — Telephone Encounter (Signed)
[facility]ole [facility] [place] calling.  Pt has appt scheduled for tomorrow for breast pain.  Would it be possible to get imaging reports and clinical notes?  PH# [phone];  F# [phone]

## 2017-12-09 ENCOUNTER — Telehealth: Payer: Self-pay

## 2017-12-09 DIAGNOSIS — Z3041 Encounter for surveillance of contraceptive pills: Secondary | ICD-10-CM

## 2017-12-09 MED ORDER — NORETHINDRONE ACET-ETHINYL EST 1.5-30 MG-MCG PO TABS: 1.0000 | ORAL_TABLET | Freq: Every day | ORAL | 0 refills | Status: DC

## 2017-12-09 NOTE — Telephone Encounter (Signed)
Pt was having trouble requesting rf thru my chart. Even with link sent, she is unable to request. Requesting rf of OCP. Her AE is 01/06/18. She is about to run out. ZO#[phone]

## 2017-12-09 NOTE — Telephone Encounter (Signed)
1 rf sent to pharmacy. Per pt, she will get text from pharmacy when rx is ready. No need to return call.

## 2017-12-10 NOTE — Telephone Encounter (Signed)
Notified pt is 32 years old & has no previous images with [place].

## 2018-01-04 ENCOUNTER — Other Ambulatory Visit: Payer: Self-pay

## 2018-01-04 ENCOUNTER — Telehealth: Payer: Self-pay

## 2018-01-04 DIAGNOSIS — Z3041 Encounter for surveillance of contraceptive pills: Secondary | ICD-10-CM

## 2018-01-04 MED ORDER — NORETHINDRONE ACET-ETHINYL EST 1.5-30 MG-MCG PO TABS: 1.0000 | ORAL_TABLET | Freq: Every day | ORAL | 0 refills | Status: DC

## 2018-01-04 NOTE — Telephone Encounter (Signed)
Sent 1 RF and pt aware of it. Reminded her of her annual 11/21.

## 2018-01-04 NOTE — Telephone Encounter (Signed)
Pt calling to get refill on bc because she had to reschedule her annual appt sense she is sick and would like to know if [facility] will fill that before she sees her sense she will run out before her appt. Thank you!

## 2018-01-06 ENCOUNTER — Ambulatory Visit: Payer: BLUE CROSS/BLUE SHIELD | Admitting: Obstetrics and Gynecology

## 2018-01-11 ENCOUNTER — Other Ambulatory Visit: Payer: Self-pay

## 2018-01-11 DIAGNOSIS — Z3041 Encounter for surveillance of contraceptive pills: Secondary | ICD-10-CM

## 2018-01-11 MED ORDER — JUNEL 1.5/30 1.5-30 MG-MCG PO TABS: 1.0000 | ORAL_TABLET | Freq: Every day | ORAL | 0 refills | Status: DC

## 2018-01-28 ENCOUNTER — Other Ambulatory Visit (HOSPITAL_COMMUNITY)
Admission: RE | Admit: 2018-01-28 | Discharge: 2018-01-28 | Disposition: A | Discharge status: Home / Self Care | Payer: BLUE CROSS/BLUE SHIELD | Source: Ambulatory Visit | Attending: Obstetrics and Gynecology | Admitting: Obstetrics and Gynecology

## 2018-01-28 ENCOUNTER — Encounter: Payer: Self-pay | Admitting: Obstetrics and Gynecology

## 2018-01-28 ENCOUNTER — Ambulatory Visit (INDEPENDENT_AMBULATORY_CARE_PROVIDER_SITE_OTHER): Payer: BLUE CROSS/BLUE SHIELD | Admitting: Obstetrics and Gynecology

## 2018-01-28 VITALS — BP 98/70 | HR 84 | Ht 63.5 in | Wt 120.0 lb

## 2018-01-28 DIAGNOSIS — Z1151 Encounter for screening for human papillomavirus (HPV): Secondary | ICD-10-CM | POA: Insufficient documentation

## 2018-01-28 DIAGNOSIS — R87810 Cervical high risk human papillomavirus (HPV) DNA test positive: Secondary | ICD-10-CM | POA: Insufficient documentation

## 2018-01-28 DIAGNOSIS — R87610 Atypical squamous cells of undetermined significance on cytologic smear of cervix (ASC-US): Secondary | ICD-10-CM | POA: Diagnosis present

## 2018-01-28 DIAGNOSIS — Z124 Encounter for screening for malignant neoplasm of cervix: Secondary | ICD-10-CM | POA: Insufficient documentation

## 2018-01-28 DIAGNOSIS — Z01419 Encounter for gynecological examination (general) (routine) without abnormal findings: Secondary | ICD-10-CM

## 2018-01-28 DIAGNOSIS — A6004 Herpesviral vulvovaginitis: Secondary | ICD-10-CM

## 2018-01-28 DIAGNOSIS — Z3041 Encounter for surveillance of contraceptive pills: Secondary | ICD-10-CM

## 2018-01-28 MED ORDER — JUNEL 1.5/30 1.5-30 MG-MCG PO TABS: 1.0000 | ORAL_TABLET | Freq: Every day | ORAL | 3 refills | Status: DC

## 2018-01-28 MED ORDER — ACYCLOVIR 400 MG PO TABS: 800.0000 mg | ORAL_TABLET | Freq: Two times a day (BID) | ORAL | 1 refills | Status: DC

## 2018-01-28 NOTE — Patient Instructions (Signed)
I value your feedback and entrusting us with your care. If you get a [facility] patient survey, I would appreciate you taking the time to let us know about your experience today. Thank you! 

## 2018-01-28 NOTE — Progress Notes (Signed)
PCP:  Patient, No Pcp Per   Chief Complaint  Patient presents with  . Gynecologic Exam     HPI:      [name] is a 32 y.o. No obstetric history on file. who LMP was Patient's last menstrual period was 01/04/2018 (exact date)., presents today for her annual examination.  Her menses are regular every 28-30 days, lasting 3-4 days.  Dysmenorrhea mild, occurring first 1-2 days of flow. She does not have intermenstrual bleeding.  Sex activity: currently sexually active, using OCPs.  Last Pap: 12/15/16 Results were: ASCUS/pos HPV DNA; Colpo 03/19/17 with CIN1. Due for repeat pap today. Hx of STDs: HSV, HPV.Takes acyclovir prn HSV outbreaks; no recent sx.   There is no FH of breast cancer. There is no FH of ovarian cancer. The patient does do self-breast exams.  Tobacco use: The patient denies current or previous tobacco use. Alcohol use: social drinker No drug use.  Exercise: moderately active  She does get adequate calcium and Vitamin D in her diet.   Past Medical History:  Diagnosis Date  . Cystitis    chronic, Dr. Achilles Dunkope  . Herpes, genital 06/2010   pos culture  . LGSIL on Pap smear of cervix 2014  . Migraines     Past Surgical History:  Procedure Laterality Date  . HIP SURGERY Left   . MOUTH SURGERY      Family History  Problem Relation Age of Onset  . Other Father        Atherosclerosis  . Liver cancer Maternal Grandfather 60       Malignant    Social History   Socioeconomic History  . Marital status: Single    Spouse name: Not on file  . Number of children: Not on file  . Years of education: Not on file  . Highest education level: Not on file  Occupational History  . Not on file  Social Needs  . Financial resource strain: Not on file  . Food insecurity:    Worry: Not on file    Inability: Not on file  . Transportation needs:    Medical: Not on file    Non-medical: Not on file  Tobacco Use  . Smoking status: Never Smoker  . Smokeless  tobacco: Never Used  Substance and Sexual Activity  . Alcohol use: Yes  . Drug use: No  . Sexual activity: Yes    Partners: Male    Birth control/protection: Pill  Lifestyle  . Physical activity:    Days per week: Not on file    Minutes per session: Not on file  . Stress: Not on file  Relationships  . Social connections:    Talks on phone: Not on file    Gets together: Not on file    Attends religious service: Not on file    Active member of club or organization: Not on file    Attends meetings of clubs or organizations: Not on file    Relationship status: Not on file  . Intimate partner violence:    Fear of current or ex partner: Not on file    Emotionally abused: Not on file    Physically abused: Not on file    Forced sexual activity: Not on file  Other Topics Concern  . Not on file  Social History Narrative  . Not on file    Current Meds  Medication Sig  . acyclovir (ZOVIRAX) 400 MG tablet Take 2 tablets (800 mg total)  by mouth 2 (two) times daily. For 5 days prn sx  . JUNEL 1.5/30 1.5-30 MG-MCG tablet Take 1 tablet by mouth daily.  . [DISCONTINUED] acyclovir (ZOVIRAX) 400 MG tablet Take 2 tablets (800 mg total) by mouth 2 (two) times daily. For 5 days prn sx  . [DISCONTINUED] JUNEL 1.5/30 1.5-30 MG-MCG tablet Take 1 tablet by mouth daily.     ROS:  Review of Systems  Constitutional: Negative for fatigue, fever and unexpected weight change.  Respiratory: Negative for cough, shortness of breath and wheezing.   Cardiovascular: Negative for chest pain, palpitations and leg swelling.  Gastrointestinal: Negative for blood in stool, constipation, diarrhea, nausea and vomiting.  Endocrine: Negative for cold intolerance, heat intolerance and polyuria.  Genitourinary: Negative for dyspareunia, dysuria, flank pain, frequency, genital sores, hematuria, menstrual problem, pelvic pain, urgency, vaginal bleeding, vaginal discharge and vaginal pain.  Musculoskeletal: Negative for  back pain, joint swelling and myalgias.  Skin: Negative for rash.  Neurological: Negative for dizziness, syncope, light-headedness, numbness and headaches.  Hematological: Negative for adenopathy.  Psychiatric/Behavioral: Negative for agitation, confusion, sleep disturbance and suicidal ideas. The patient is not nervous/anxious.      Objective: BP 98/70   Pulse 84   Ht 5' 3.5" (1.613 m)   Wt 120 lb (54.4 kg)   LMP 01/04/2018 (Exact Date)   BMI 20.92 kg/m    Physical Exam  Constitutional: She is oriented to person, place, and time. She appears well-developed and well-nourished.  Genitourinary: Vagina normal and uterus normal. There is no rash or tenderness on the right labia. There is no rash or tenderness on the left labia. No erythema or tenderness in the vagina. No vaginal discharge found. Right adnexum does not display mass and does not display tenderness. Left adnexum does not display mass and does not display tenderness. Cervix does not exhibit motion tenderness or polyp. Uterus is not enlarged or tender.  Neck: Normal range of motion. No thyromegaly present.  Cardiovascular: Normal rate, regular rhythm and normal heart sounds.  No murmur heard. Pulmonary/Chest: Effort normal and breath sounds normal. Right breast exhibits no mass, no nipple discharge, no skin change and no tenderness. Left breast exhibits no mass, no nipple discharge, no skin change and no tenderness.  Abdominal: Soft. There is no tenderness. There is no guarding.  Musculoskeletal: Normal range of motion.  Neurological: She is alert and oriented to person, place, and time. No cranial nerve deficit.  Psychiatric: She has a normal mood and affect. Her behavior is normal.  Vitals reviewed.   Assessment/Plan: Encounter for annual routine gynecological examination  Cervical cancer screening - Plan: Cytology - PAP  Screening for HPV (human papillomavirus) - Plan: Cytology - PAP  ASCUS with positive high risk  HPV cervical - Repeat pap today. Will call wiht results and dispo. - Plan: Cytology - PAP  Encounter for surveillance of contraceptive pills - OCP RF. Pt needs brand only.  - Plan: JUNEL 1.5/30 1.5-30 MG-MCG tablet  Herpes simplex vulvovaginitis - Rx RF acyclovir prn outbreaks. - Plan: acyclovir (ZOVIRAX) 400 MG tablet  Meds ordered this encounter  Medications  . JUNEL 1.5/30 1.5-30 MG-MCG tablet    Sig: Take 1 tablet by mouth daily.    Dispense:  3 Package    Refill:  3    DO NOT SUBSTITUTE    Order Specific Question:   Supervising Provider    Answer:   Nadara Mustard [id number]  . acyclovir (ZOVIRAX) 400 MG tablet    Sig:  Take 2 tablets (800 mg total) by mouth 2 (two) times daily. For 5 days prn sx    Dispense:  30 tablet    Refill:  1    Order Specific Question:   Supervising Provider    Answer:   Nadara Mustard [[id number]]             GYN counsel use and side effects of OCP's, adequate intake of calcium and vitamin D, diet and exercise     F/U  Return in about 1 year (around 01/29/2019).  Zakya Halabi B. Tjay Velazquez, PA-C 01/28/2018 9:15 AM

## 2018-01-31 LAB — CYTOLOGY - PAP: HPV (WINDOPATH): DETECTED — AB

## 2018-02-01 ENCOUNTER — Telehealth: Payer: Self-pay | Admitting: Obstetrics and Gynecology

## 2018-02-01 NOTE — Telephone Encounter (Signed)
Pt aware of abn pap again. Due for repeat colpo with Dr. Bonney AidStaebler. Pt plans to call back to sched for 1/20.

## 2018-02-08 ENCOUNTER — Telehealth: Payer: Self-pay

## 2018-02-08 NOTE — Telephone Encounter (Signed)
Patient is returning call. Please advise? 

## 2018-02-08 NOTE — Telephone Encounter (Signed)
LMVM TRC. INFO That can be relayed per Birdie SonsBruce Swords from [place]: [place] has signed contracts with [employer] [place]. This competitively priced plan offers in-network benefits at all [facility] facilities and affiliated providers throughout our service area.  Patients can enroll in [place] [employer] [place] by going to healthcare.gov before December 15.  If patients are already enrolled in a plan for 2020 that does not include [place], they can change their selection. They can re-enroll in [employer] any time before December 15.  We are in talks with [facility] about plans that don't include [place]. We will let you know if these talks bear fruit.

## 2018-02-08 NOTE — Telephone Encounter (Signed)
Pt has [employer] Blue Value that will be changing to [facility]. It's the same plan, but they did not show where [facility] would be in network & she wouldn't have to pay for out of network coverage. Pt inquiring if we will be accepting her new plan. WU#[phone]#[phone]

## 2018-02-09 NOTE — Telephone Encounter (Signed)
Pt aware.

## 2018-04-12 ENCOUNTER — Encounter: Payer: Self-pay | Admitting: Obstetrics and Gynecology

## 2018-04-12 ENCOUNTER — Other Ambulatory Visit (HOSPITAL_COMMUNITY)
Admission: RE | Admit: 2018-04-12 | Discharge: 2018-04-12 | Disposition: A | Discharge status: Home / Self Care | Payer: BLUE CROSS/BLUE SHIELD | Source: Ambulatory Visit | Attending: Obstetrics and Gynecology | Admitting: Obstetrics and Gynecology

## 2018-04-12 ENCOUNTER — Ambulatory Visit (INDEPENDENT_AMBULATORY_CARE_PROVIDER_SITE_OTHER): Payer: BLUE CROSS/BLUE SHIELD | Admitting: Obstetrics and Gynecology

## 2018-04-12 VITALS — BP 110/62 | Ht 63.5 in | Wt 123.5 lb

## 2018-04-12 DIAGNOSIS — R87612 Low grade squamous intraepithelial lesion on cytologic smear of cervix (LGSIL): Secondary | ICD-10-CM | POA: Insufficient documentation

## 2018-04-12 DIAGNOSIS — N870 Mild cervical dysplasia: Secondary | ICD-10-CM

## 2018-04-12 NOTE — Progress Notes (Signed)
   GYNECOLOGY CLINIC COLPOSCOPY PROCEDURE NOTE  33 y.o. G0P0000 here for colposcopy for low-grade squamous intraepithelial neoplasia (LGSIL - encompassing HPV,mild dysplasia,CIN I)  pap smear on 01/28/18. Discussed underlying role for HPV infection in the development of cervical dysplasia, its natural history and progression/regression, need for surveillance.  Is the patient  pregnant: No LMP: Patient's last menstrual period was 03/22/2018 (exact date). Smoking status:  reports that she has never smoked. She has never used smokeless tobacco. Future fertility desired:  Yes  Patient given informed consent, signed copy in the chart, time out was performed.  The patient was position in dorsal lithotomy position. Speculum was placed the cervix was visualized.   After application of acetic acid colposcopic inspection of the cervix was undertaken.   Colposcopy adequate, full visualization of transformation zone: Yes acetowhite lesion(s) noted at 5 and 7 o'clock; corresponding biopsies obtained.   ECC specimen obtained:  Yes  All specimens were labeled and sent to pathology.   Patient was given post procedure instructions.  Will follow up pathology and manage accordingly.  Routine preventative health maintenance measures emphasized.  Physical Exam Genitourinary:        Adelene Idler MD [facility], [facility] 04/12/2018 10:07 AM

## 2018-04-12 NOTE — Addendum Note (Signed)
Addended by: Adelene Idler on: 04/12/2018 10:22 AM   Modules accepted: Orders

## 2018-04-12 NOTE — Patient Instructions (Signed)
COLPOSCOPY POST-PROCEDURE INSTRUCTIONS  1. You may take Ibuprofen, Aleve or Tylenol for cramping if needed.  2. If Monsel's solution was used, you will have a black discharge.  3. Light bleeding is normal.  If bleeding is heavier than your period, please call.  4. Put nothing in your vagina until the bleeding or discharge stops (usually 2 or3 days).  5. We will call you within one week with biopsy results or discuss the results at your follow-up appointment if needed.  Colposcopy, Care After This sheet gives you information about how to care for yourself after your procedure. Your doctor may also give you more specific instructions. If you have problems or questions, contact your doctor. What can I expect after the procedure? If you did not have a tissue sample removed (did not have a biopsy), you may only have some spotting for a few days. You can go back to your normal activities. If you had a tissue sample removed, it is common to have:  Soreness and pain. This may last for a few days.  Light-headedness.  Mild bleeding from your vagina or dark-colored, grainy discharge from your vagina. This may last for a few days. You may need to wear a sanitary pad.  Spotting for at least 48 hours after the procedure. Follow these instructions at home:   Take over-the-counter and prescription medicines only as told by your doctor. Ask your doctor what medicines you can start taking again. This is very important if you take blood-thinning medicine.  Do not drive or use heavy machinery while taking prescription pain medicine.  For 3 days, or as long as your doctor tells you, avoid: ? Douching. ? Using tampons. ? Having sex.  If you use birth control (contraception), keep using it.  Limit activity for the first day after the procedure. Ask your doctor what activities are safe for you.  It is up to you to get the results of your procedure. Ask your doctor when your results will be  ready.  Keep all follow-up visits as told by your doctor. This is important. Contact a doctor if:  You get a skin rash. Get help right away if:  You are bleeding a lot from your vagina. It is a lot of bleeding if you are using more than one pad an hour for 2 hours in a row.  You have clumps of blood (blood clots) coming from your vagina.  You have a fever.  You have chills  You have pain in your lower belly (pelvic area).  You have signs of infection, such as vaginal discharge that is: ? Different than usual. ? Yellow. ? Bad-smelling.  You have very pain or cramps in your lower belly that do not get better with medicine.  You feel light-headed.  You feel dizzy.  You pass out (faint). Summary  If you did not have a tissue sample removed (did not have a biopsy), you may only have some spotting for a few days. You can go back to your normal activities.  If you had a tissue sample removed, it is common to have mild pain and spotting for 48 hours.  For 3 days, or as long as your doctor tells you, avoid douching, using tampons and having sex.  Get help right away if you have bleeding, very bad pain, or signs of infection. This information is not intended to replace advice given to you by your health care provider. Make sure you discuss any questions you have with   your health care provider. Document Released: 08/13/2007 Document Revised: 11/14/2015 Document Reviewed: 11/14/2015 Elsevier Interactive Patient Education  2019 Elsevier Inc.  

## 2018-04-14 ENCOUNTER — Encounter: Payer: Self-pay | Admitting: Obstetrics and Gynecology

## 2018-04-16 ENCOUNTER — Other Ambulatory Visit: Payer: Self-pay | Admitting: Obstetrics and Gynecology

## 2018-04-16 DIAGNOSIS — F419 Anxiety disorder, unspecified: Secondary | ICD-10-CM

## 2018-04-16 MED ORDER — ALPRAZOLAM 0.5 MG PO TABS: 0.5000 mg | ORAL_TABLET | Freq: Every evening | ORAL | 0 refills | Status: DC | PRN

## 2018-04-16 NOTE — Progress Notes (Signed)
Hello, Please call this patient about scheduling a LEEP.  Thank you,  Dr. Jerene Pitch

## 2018-04-16 NOTE — Progress Notes (Signed)
Discussed on phone, patient would like a LEEP. Message sent to schedule.

## 2018-04-19 ENCOUNTER — Telehealth: Payer: Self-pay | Admitting: Obstetrics and Gynecology

## 2018-04-19 NOTE — Telephone Encounter (Signed)
Lmtrc

## 2018-04-26 NOTE — Telephone Encounter (Signed)
Lmtrc

## 2018-04-28 NOTE — Telephone Encounter (Signed)
Rec'd v/m that patient was returning my calls, but that she was at work and asked that I leave a message letting her know the nature of the call. L/m that I was calling to schedule the in-office LEEP, and to return my call at her convenience.

## 2018-05-13 ENCOUNTER — Telehealth: Payer: Self-pay

## 2018-05-13 ENCOUNTER — Encounter: Payer: Self-pay | Admitting: Obstetrics and Gynecology

## 2018-05-13 NOTE — Telephone Encounter (Signed)
Called pt to get more details of sxs. No answer, LVMTRC.

## 2018-05-13 NOTE — Telephone Encounter (Signed)
Pt returning GA's call.  [phone]

## 2018-05-13 NOTE — Telephone Encounter (Signed)
Pt states she is having sxs of UTI.  Can she go ahead and get nitroferon (that's what it sounded like)?  [facility] [place]. [phone]

## 2018-05-14 ENCOUNTER — Other Ambulatory Visit: Payer: Self-pay | Admitting: Maternal Newborn

## 2018-05-14 DIAGNOSIS — N390 Urinary tract infection, site not specified: Secondary | ICD-10-CM

## 2018-05-14 MED ORDER — NITROFURANTOIN MONOHYD MACRO 100 MG PO CAPS: 100.0000 mg | ORAL_CAPSULE | Freq: Two times a day (BID) | ORAL | 1 refills | Status: DC

## 2018-05-14 NOTE — Telephone Encounter (Signed)
Called pt, no answer, did not leave voice msg and replied to her msg throught mychart.

## 2018-05-14 NOTE — Telephone Encounter (Signed)
Burning sensation when urinating and odor, no frequency or blood in urine. Can you please advise in ABC's absence, pt doesn't want to take chances if Helmut Muster doesn't read the msg today.

## 2018-05-14 NOTE — Telephone Encounter (Signed)
Reviewed patient message, Rx sent to pharmacy.

## 2018-05-14 NOTE — Progress Notes (Signed)
Rx for nitrofurantoin sent to pharmacy.

## 2018-05-26 ENCOUNTER — Telehealth: Payer: Self-pay | Admitting: Obstetrics and Gynecology

## 2018-05-26 NOTE — Telephone Encounter (Signed)
Lmtrc

## 2018-06-02 NOTE — Telephone Encounter (Signed)
Per Dr. Jerene Pitch, she had a phone conversation with the patient today, and patient will call me to let me know if she wants the LEEP or if she will f/u with Dr. Jerene Pitch in one year.

## 2018-06-02 NOTE — Telephone Encounter (Signed)
Hour long conversation with Julie Christian about option for LEEP vs continued monitoring for 1 year. She would like to be tested for specific HPV strain which could be done at a future visit. She will consider HPV vaccination.I encouraged her to  She is undecided about LEEP vs monitoring and she will decide and call the office when she does.   Adelene Idler MD [facility], [facility] 06/02/2018 9:30 AM

## 2018-10-06 ENCOUNTER — Other Ambulatory Visit: Payer: Self-pay

## 2018-10-06 DIAGNOSIS — Z20822 Contact with and (suspected) exposure to covid-19: Secondary | ICD-10-CM

## 2018-10-08 LAB — NOVEL CORONAVIRUS, NAA: SARS-CoV-2, NAA: NOT DETECTED

## 2018-11-18 ENCOUNTER — Encounter: Payer: Self-pay | Admitting: Obstetrics and Gynecology

## 2018-11-18 ENCOUNTER — Other Ambulatory Visit: Payer: Self-pay | Admitting: Obstetrics and Gynecology

## 2018-11-18 DIAGNOSIS — A6004 Herpesviral vulvovaginitis: Secondary | ICD-10-CM

## 2018-11-18 MED ORDER — ACYCLOVIR 400 MG PO TABS: 800.0000 mg | ORAL_TABLET | Freq: Two times a day (BID) | ORAL | 0 refills | Status: AC

## 2018-11-18 NOTE — Progress Notes (Signed)
Rx RF acyclovir prn sx.

## 2018-12-17 ENCOUNTER — Telehealth: Payer: Self-pay | Admitting: Obstetrics and Gynecology

## 2018-12-17 ENCOUNTER — Other Ambulatory Visit: Payer: Self-pay

## 2018-12-17 DIAGNOSIS — Z3041 Encounter for surveillance of contraceptive pills: Secondary | ICD-10-CM

## 2018-12-17 MED ORDER — JUNEL 1.5/30 1.5-30 MG-MCG PO TABS: 1.0000 | ORAL_TABLET | Freq: Every day | ORAL | 0 refills | Status: DC

## 2018-12-17 NOTE — Telephone Encounter (Signed)
Patient scheduled 11/30 for annual with ABC but will need refill on bc to get her to that visit.  [facility] [place].

## 2018-12-17 NOTE — Telephone Encounter (Signed)
RF sent.

## 2019-02-06 DIAGNOSIS — R87612 Low grade squamous intraepithelial lesion on cytologic smear of cervix (LGSIL): Secondary | ICD-10-CM | POA: Insufficient documentation

## 2019-02-06 NOTE — Progress Notes (Deleted)
PCP:  Patient, No Pcp Per   No chief complaint on file.    HPI:      [name] is a 33 y.o. No obstetric history on file. who LMP was No LMP recorded., presents today for her annual examination.  Her menses are regular every 28-30 days, lasting 3-4 days.  Dysmenorrhea mild, occurring first 1-2 days of flow. She does not have intermenstrual bleeding.  Sex activity: currently sexually active, using OCPs.  Last Pap: 01/28/18  LGSIL, pos HPV DNA. Repeat colpo 2/20 with CIN 1, LEEP recommended vs mointoring. 12/15/16 Results were: ASCUS/pos HPV DNA; Colpo 03/19/17 with CIN1. Due for repeat pap today, wants HPV type. Hx of STDs: HSV, HPV.Takes acyclovir prn HSV outbreaks; no recent sx.   There is no FH of breast cancer. There is no FH of ovarian cancer. The patient does do self-breast exams.  Tobacco use: The patient denies current or previous tobacco use. Alcohol use: social drinker No drug use.  Exercise: moderately active  She does get adequate calcium and Vitamin D in her diet.   Past Medical History:  Diagnosis Date  . Cystitis    chronic, Dr. Achilles Dunk  . Herpes, genital 06/2010   pos culture  . LGSIL on Pap smear of cervix 2014  . Migraines     Past Surgical History:  Procedure Laterality Date  . HIP SURGERY Left   . MOUTH SURGERY      Family History  Problem Relation Age of Onset  . Other Father        Atherosclerosis  . Liver cancer Maternal Grandfather 60       Malignant    Social History   Socioeconomic History  . Marital status: Single    Spouse name: Not on file  . Number of children: Not on file  . Years of education: Not on file  . Highest education level: Not on file  Occupational History  . Not on file  Social Needs  . Financial resource strain: Not on file  . Food insecurity    Worry: Not on file    Inability: Not on file  . Transportation needs    Medical: Not on file    Non-medical: Not on file  Tobacco Use  . Smoking status:  Never Smoker  . Smokeless tobacco: Never Used  Substance and Sexual Activity  . Alcohol use: Yes  . Drug use: No  . Sexual activity: Yes    Partners: Male    Birth control/protection: Pill  Lifestyle  . Physical activity    Days per week: Not on file    Minutes per session: Not on file  . Stress: Not on file  Relationships  . Social Musician on phone: Not on file    Gets together: Not on file    Attends religious service: Not on file    Active member of club or organization: Not on file    Attends meetings of clubs or organizations: Not on file    Relationship status: Not on file  . Intimate partner violence    Fear of current or ex partner: Not on file    Emotionally abused: Not on file    Physically abused: Not on file    Forced sexual activity: Not on file  Other Topics Concern  . Not on file  Social History Narrative  . Not on file    No outpatient medications have been marked as taking for the  02/07/19 encounter (Appointment) with Ahad Colarusso, Deirdre Evener, PA-C.     ROS:  Review of Systems  Constitutional: Negative for fatigue, fever and unexpected weight change.  Respiratory: Negative for cough, shortness of breath and wheezing.   Cardiovascular: Negative for chest pain, palpitations and leg swelling.  Gastrointestinal: Negative for blood in stool, constipation, diarrhea, nausea and vomiting.  Endocrine: Negative for cold intolerance, heat intolerance and polyuria.  Genitourinary: Negative for dyspareunia, dysuria, flank pain, frequency, genital sores, hematuria, menstrual problem, pelvic pain, urgency, vaginal bleeding, vaginal discharge and vaginal pain.  Musculoskeletal: Negative for back pain, joint swelling and myalgias.  Skin: Negative for rash.  Neurological: Negative for dizziness, syncope, light-headedness, numbness and headaches.  Hematological: Negative for adenopathy.  Psychiatric/Behavioral: Negative for agitation, confusion, sleep disturbance  and suicidal ideas. The patient is not nervous/anxious.      Objective: There were no vitals taken for this visit.   Physical Exam Constitutional:      Appearance: She is well-developed.  Genitourinary:     Vagina and uterus normal.     No vaginal discharge, erythema or tenderness.     No cervical motion tenderness or polyp.     Uterus is not enlarged or tender.     No right or left adnexal mass present.     Right adnexa not tender.     Left adnexa not tender.  Neck:     Musculoskeletal: Normal range of motion.     Thyroid: No thyromegaly.  Cardiovascular:     Rate and Rhythm: Normal rate and regular rhythm.     Heart sounds: Normal heart sounds. No murmur.  Pulmonary:     Effort: Pulmonary effort is normal.     Breath sounds: Normal breath sounds.  Chest:     Breasts:        Right: No mass, nipple discharge, skin change or tenderness.        Left: No mass, nipple discharge, skin change or tenderness.  Abdominal:     Palpations: Abdomen is soft.     Tenderness: There is no abdominal tenderness. There is no guarding.  Musculoskeletal: Normal range of motion.  Neurological:     Mental Status: She is alert and oriented to person, place, and time.     Cranial Nerves: No cranial nerve deficit.  Psychiatric:        Behavior: Behavior normal.  Vitals signs reviewed.     Assessment/Plan: No diagnosis found.  No orders of the defined types were placed in this encounter.            GYN counsel use and side effects of OCP's, adequate intake of calcium and vitamin D, diet and exercise     F/U  No follow-ups on file.  Bennye Nix B. Kenshin Splawn, PA-C 02/06/2019 4:39 PM

## 2019-02-07 ENCOUNTER — Ambulatory Visit: Payer: BLUE CROSS/BLUE SHIELD | Admitting: Obstetrics and Gynecology

## 2019-04-01 ENCOUNTER — Encounter: Payer: Self-pay | Admitting: Emergency Medicine

## 2019-04-01 ENCOUNTER — Other Ambulatory Visit: Payer: Self-pay

## 2019-04-01 ENCOUNTER — Ambulatory Visit (INDEPENDENT_AMBULATORY_CARE_PROVIDER_SITE_OTHER): Payer: BLUE CROSS/BLUE SHIELD

## 2019-04-01 ENCOUNTER — Ambulatory Visit
Admission: EM | Admit: 2019-04-01 | Discharge: 2019-04-01 | Disposition: A | Discharge status: Home / Self Care | Payer: BLUE CROSS/BLUE SHIELD | Attending: Family Medicine | Admitting: Family Medicine

## 2019-04-01 DIAGNOSIS — R0602 Shortness of breath: Secondary | ICD-10-CM | POA: Diagnosis not present

## 2019-04-01 DIAGNOSIS — U071 COVID-19: Secondary | ICD-10-CM

## 2019-04-01 MED ORDER — ALBUTEROL SULFATE HFA 108 (90 BASE) MCG/ACT IN AERS: 1.0000 | INHALATION_SPRAY | Freq: Four times a day (QID) | RESPIRATORY_TRACT | 0 refills | Status: AC | PRN

## 2019-04-01 MED ORDER — PREDNISONE 50 MG PO TABS: ORAL_TABLET | ORAL | 0 refills | Status: AC

## 2019-04-01 NOTE — ED Provider Notes (Signed)
[facility]    CSN: [id number] Arrival date & time: 04/01/19  1152  History   Chief Complaint Chief Complaint  Patient presents with  . COVID POSITIVE  . Shortness of Breath   HPI 34 year old female presents with shortness of breath.  Patient started developing symptoms last Wednesday.  Had congestion and cough.  Was swabbed for Covid on Saturday.  Result returned positive.  Patient reports that her congestion has improved.  She is now experiencing shortness of breath and chest tightness.  She states that she feels like she cannot take a good deep breath.  She states that when she takes a deep breath it incites a coughing spell.  No current fever.  Patient does note fatigue.  No medications or interventions tried.  No relieving factors.  She has been in touch with the health department who advised her to come in for evaluation.  No other complaints.   Past Medical History:  Diagnosis Date  . Cystitis    chronic, Dr. Jacqlyn Larsen  . Herpes, genital 06/2010   pos culture  . LGSIL on Pap smear of cervix 2014  . Migraines     Patient Active Problem List   Diagnosis Date Noted  . LGSIL on Pap smear of cervix 02/06/2019  . ASCUS with positive high risk HPV cervical 01/28/2018    Past Surgical History:  Procedure Laterality Date  . HIP SURGERY Left   . MOUTH SURGERY      OB History    Gravida  0   Para  0   Term  0   Preterm  0   AB  0   Living  0     SAB  0   TAB  0   Ectopic  0   Multiple  0   Live Births  0            Home Medications    Prior to Admission medications   Medication Sig Start Date End Date Taking? Authorizing Provider  acyclovir (ZOVIRAX) 400 MG tablet Take 2 tablets (800 mg total) by mouth 2 (two) times daily. For 5 days prn sx 5/99/35  Yes Copland, Deirdre Evener, PA-C  JUNEL 1.5/30 1.5-30 MG-MCG tablet Take 1 tablet by mouth daily. 70/1/77  Yes Copland, Elmo Putt B, PA-C  albuterol (VENTOLIN HFA) 108 (90 Base) MCG/ACT inhaler  Inhale 1-2 puffs into the lungs every 6 (six) hours as needed for wheezing or shortness of breath. 04/01/19   Coral Spikes, DO  predniSONE (DELTASONE) 50 MG tablet 1 tablet daily x 5 days 04/01/19   Coral Spikes, DO    Family History Family History  Problem Relation Age of Onset  . Other Father        Atherosclerosis  . Asthma Father   . Liver cancer Maternal Grandfather 35       Malignant    Social History Social History   Tobacco Use  . Smoking status: Never Smoker  . Smokeless tobacco: Never Used  Substance Use Topics  . Alcohol use: Yes  . Drug use: No     Allergies   Vicodin [hydrocodone-acetaminophen]   Review of Systems Review of Systems  Constitutional: Positive for fatigue.  Respiratory: Positive for chest tightness and shortness of breath.    Physical Exam Triage Vital Signs ED Triage Vitals  Enc Vitals Group     BP 04/01/19 1217 132/87     Pulse Rate 04/01/19 1217 (!) 123     Resp 04/01/19  1217 20     Temp 04/01/19 1217 99 F (37.2 C)     Temp Source 04/01/19 1217 Oral     SpO2 04/01/19 1217 100 %     Weight 04/01/19 1214 125 lb (56.7 kg)     Height 04/01/19 1214 5\' 3"  (1.6 m)     Head Circumference --      Peak Flow --      Pain Score 04/01/19 1213 0     Pain Loc --      Pain Edu? --      Excl. in [place]? --    Updated Vital Signs BP 132/87 (BP Location: Left Arm)   Pulse (!) 123   Temp 99 F (37.2 C) (Oral)   Resp 20   Ht 5\' 3"  (1.6 m)   Wt 56.7 kg   LMP 03/14/2019 (Approximate)   SpO2 100%   BMI 22.14 kg/m   Visual Acuity Right Eye Distance:   Left Eye Distance:   Bilateral Distance:    Right Eye Near:   Left Eye Near:    Bilateral Near:     Physical Exam Vitals and nursing note reviewed.  Constitutional:      General: She is not in acute distress.    Appearance: She is well-developed. She is not ill-appearing.  HENT:     Head: Normocephalic and atraumatic.  Eyes:     General:        Right eye: No discharge.        Left  eye: No discharge.     Conjunctiva/sclera: Conjunctivae normal.  Cardiovascular:     Rate and Rhythm: Tachycardia present.     Heart sounds: No murmur.  Pulmonary:     Effort: Pulmonary effort is normal.     Breath sounds: Normal breath sounds. No wheezing, rhonchi or rales.  Neurological:     Mental Status: She is alert.  Psychiatric:        Mood and Affect: Mood is anxious.        Behavior: Behavior normal.    UC Treatments / Results  Labs (all labs ordered are listed, but only abnormal results are displayed) Labs Reviewed - No data to display  EKG   Radiology DG Chest 2 View  Result Date: 04/01/2019 CLINICAL DATA:  Cough and shortness of breath EXAM: CHEST - 2 VIEW COMPARISON:  None. FINDINGS: Lungs are clear. Heart size and pulmonary vascularity are normal. No adenopathy. No bone lesions. IMPRESSION: Lungs clear.  Cardiac silhouette normal.  No adenopathy. Electronically Signed   By: 05/12/2019 III M.D.   On: 04/01/2019 13:08    Procedures Procedures (including critical care time)  Medications Ordered in UC Medications - No data to display  Initial Impression / Assessment and Plan / UC Course  I have reviewed the triage vital signs and the nursing notes.  Pertinent labs & imaging results that were available during my care of the patient were reviewed by me and considered in my medical decision making (see chart for details).    34 year old female presents with shortness of breath in the setting of COVID-19.  Chest x-ray negative.  Patient is not hypoxic.  Patient noted to be tachycardic.  Exam otherwise unremarkable.  Given symptomatology, placing on albuterol and prednisone.  Advised to return or seek medical attention elsewhere if she fails to improve or worsens.  Final Clinical Impressions(s) / UC Diagnoses   Final diagnoses:  SOB (shortness of breath)  COVID-19  Discharge Instructions     Rest.  Get yourself a home pulse oximeter to check  daily.  Chest xray normal  Medications as prescribed.  Take care  Dr. Adriana Simas    ED Prescriptions    Medication Sig Dispense Auth. Provider   albuterol (VENTOLIN HFA) 108 (90 Base) MCG/ACT inhaler Inhale 1-2 puffs into the lungs every 6 (six) hours as needed for wheezing or shortness of breath. 18 g Hermenegildo Clausen G, DO   predniSONE (DELTASONE) 50 MG tablet 1 tablet daily x 5 days 5 tablet Everlene Other G, DO     PDMP not reviewed this encounter.   Tommie Sams, [place] 04/01/19 1356

## 2019-04-01 NOTE — Discharge Instructions (Addendum)
Rest.  Get yourself a home pulse oximeter to check daily.  Chest xray normal  Medications as prescribed.  Take care  Dr. Adriana Simas

## 2019-04-01 NOTE — ED Triage Notes (Signed)
Pt c/o shortness of breath and tightness in chest. She noticed these symptoms yesterday. She tested positive for covid about 6 days ago. Her other symptoms have gotten better but the shortness of breath and the health department advised her to seek medical attention.

## 2019-04-07 ENCOUNTER — Other Ambulatory Visit: Payer: BLUE CROSS/BLUE SHIELD

## 2019-04-11 ENCOUNTER — Other Ambulatory Visit: Payer: Self-pay

## 2019-04-11 ENCOUNTER — Encounter: Payer: Self-pay | Admitting: Obstetrics and Gynecology

## 2019-04-11 DIAGNOSIS — Z3041 Encounter for surveillance of contraceptive pills: Secondary | ICD-10-CM

## 2019-04-11 MED ORDER — JUNEL 1.5/30 1.5-30 MG-MCG PO TABS: 1.0000 | ORAL_TABLET | Freq: Every day | ORAL | 0 refills | Status: AC

## 2019-04-26 ENCOUNTER — Ambulatory Visit: Payer: BLUE CROSS/BLUE SHIELD | Attending: Internal Medicine

## 2019-04-26 DIAGNOSIS — Z20822 Contact with and (suspected) exposure to covid-19: Secondary | ICD-10-CM

## 2019-04-27 LAB — NOVEL CORONAVIRUS, NAA: SARS-CoV-2, NAA: NOT DETECTED

## 2019-05-02 NOTE — Progress Notes (Deleted)
PCP:  Patient, No Pcp Per   No chief complaint on file.    HPI:      [name] is a 34 y.o. No obstetric history on file. who LMP was No LMP recorded., presents today for her annual examination.  Her menses are regular every 28-30 days, lasting 3-4 days.  Dysmenorrhea mild, occurring first 1-2 days of flow. She does not have intermenstrual bleeding.  Sex activity: currently sexually active, using OCPs.  Last Pap: 01/28/18  Results were: LGSIL, pos HPV DNA. 2018 results were ASCUS/pos HPV DNA; Colpo 03/19/17 and 04/13/19 with CIN1. Discussed LEEP; Due for repeat pap today. Hx of STDs: HSV, HPV.Takes acyclovir prn HSV outbreaks; no recent sx.   There is no FH of breast cancer. There is no FH of ovarian cancer. The patient does do self-breast exams.  Tobacco use: The patient denies current or previous tobacco use. Alcohol use: social drinker No drug use.  Exercise: moderately active  She does get adequate calcium and Vitamin D in her diet.   Past Medical History:  Diagnosis Date  . Cystitis    chronic, Dr. Achilles Dunk  . Herpes, genital 06/2010   pos culture  . LGSIL on Pap smear of cervix 2014  . Migraines     Past Surgical History:  Procedure Laterality Date  . HIP SURGERY Left   . MOUTH SURGERY      Family History  Problem Relation Age of Onset  . Other Father        Atherosclerosis  . Asthma Father   . Liver cancer Maternal Grandfather 60       Malignant    Social History   Socioeconomic History  . Marital status: Single    Spouse name: Not on file  . Number of children: Not on file  . Years of education: Not on file  . Highest education level: Not on file  Occupational History  . Not on file  Tobacco Use  . Smoking status: Never Smoker  . Smokeless tobacco: Never Used  Substance and Sexual Activity  . Alcohol use: Yes  . Drug use: No  . Sexual activity: Yes    Partners: Male    Birth control/protection: Pill  Other Topics Concern  . Not on  file  Social History Narrative  . Not on file   Social Determinants of Health   Financial Resource Strain:   . Difficulty of Paying Living Expenses: Not on file  Food Insecurity:   . Worried About Programme researcher, broadcasting/film/video in the Last Year: Not on file  . Ran Out of Food in the Last Year: Not on file  Transportation Needs:   . Lack of Transportation (Medical): Not on file  . Lack of Transportation (Non-Medical): Not on file  Physical Activity:   . Days of Exercise per Week: Not on file  . Minutes of Exercise per Session: Not on file  Stress:   . Feeling of Stress : Not on file  Social Connections:   . Frequency of Communication with Friends and Family: Not on file  . Frequency of Social Gatherings with Friends and Family: Not on file  . Attends Religious Services: Not on file  . Active Member of Clubs or Organizations: Not on file  . Attends Banker Meetings: Not on file  . Marital Status: Not on file  Intimate Partner Violence:   . Fear of Current or Ex-Partner: Not on file  . Emotionally Abused: Not on  file  . Physically Abused: Not on file  . Sexually Abused: Not on file    No outpatient medications have been marked as taking for the 05/03/19 encounter (Appointment) with Elya Diloreto, Deirdre Evener, PA-C.     ROS:  Review of Systems  Constitutional: Negative for fatigue, fever and unexpected weight change.  Respiratory: Negative for cough, shortness of breath and wheezing.   Cardiovascular: Negative for chest pain, palpitations and leg swelling.  Gastrointestinal: Negative for blood in stool, constipation, diarrhea, nausea and vomiting.  Endocrine: Negative for cold intolerance, heat intolerance and polyuria.  Genitourinary: Negative for dyspareunia, dysuria, flank pain, frequency, genital sores, hematuria, menstrual problem, pelvic pain, urgency, vaginal bleeding, vaginal discharge and vaginal pain.  Musculoskeletal: Negative for back pain, joint swelling and myalgias.    Skin: Negative for rash.  Neurological: Negative for dizziness, syncope, light-headedness, numbness and headaches.  Hematological: Negative for adenopathy.  Psychiatric/Behavioral: Negative for agitation, confusion, sleep disturbance and suicidal ideas. The patient is not nervous/anxious.      Objective: There were no vitals taken for this visit.   Physical Exam Constitutional:      Appearance: She is well-developed.  Genitourinary:     Vagina and uterus normal.     No vaginal discharge, erythema or tenderness.     No cervical motion tenderness or polyp.     Uterus is not enlarged or tender.     No right or left adnexal mass present.     Right adnexa not tender.     Left adnexa not tender.  Neck:     Thyroid: No thyromegaly.  Cardiovascular:     Rate and Rhythm: Normal rate and regular rhythm.     Heart sounds: Normal heart sounds. No murmur.  Pulmonary:     Effort: Pulmonary effort is normal.     Breath sounds: Normal breath sounds.  Chest:     Breasts:        Right: No mass, nipple discharge, skin change or tenderness.        Left: No mass, nipple discharge, skin change or tenderness.  Abdominal:     Palpations: Abdomen is soft.     Tenderness: There is no abdominal tenderness. There is no guarding.  Musculoskeletal:        General: Normal range of motion.     Cervical back: Normal range of motion.  Neurological:     Mental Status: She is alert and oriented to person, place, and time.     Cranial Nerves: No cranial nerve deficit.  Psychiatric:        Behavior: Behavior normal.  Vitals reviewed.     Assessment/Plan: No diagnosis found.  No orders of the defined types were placed in this encounter.            GYN counsel use and side effects of OCP's, adequate intake of calcium and vitamin D, diet and exercise     F/U  No follow-ups on file.  Aarti Mankowski B. [place], PA-C 05/02/2019 2:17 PM

## 2019-05-03 ENCOUNTER — Ambulatory Visit: Payer: BLUE CROSS/BLUE SHIELD | Admitting: Obstetrics and Gynecology

## 2019-05-19 ENCOUNTER — Ambulatory Visit: Payer: BLUE CROSS/BLUE SHIELD | Attending: Internal Medicine

## 2019-05-19 DIAGNOSIS — Z20822 Contact with and (suspected) exposure to covid-19: Secondary | ICD-10-CM

## 2019-05-20 LAB — NOVEL CORONAVIRUS, NAA: SARS-CoV-2, NAA: NOT DETECTED

## 2019-05-26 ENCOUNTER — Other Ambulatory Visit: Payer: BLUE CROSS/BLUE SHIELD

## 2019-07-22 ENCOUNTER — Ambulatory Visit: Payer: BC Managed Care – PPO | Attending: Internal Medicine

## 2019-07-22 ENCOUNTER — Other Ambulatory Visit: Payer: BC Managed Care – PPO

## 2019-07-22 DIAGNOSIS — Z20822 Contact with and (suspected) exposure to covid-19: Secondary | ICD-10-CM

## 2019-07-23 LAB — NOVEL CORONAVIRUS, NAA: SARS-CoV-2, NAA: NOT DETECTED

## 2019-07-23 LAB — SARS-COV-2, NAA 2 DAY TAT

## 2020-05-31 IMAGING — CR DG CHEST 2V
2 series · 2 of 2 positions shown · non-contrast
Comparison: None.

CLINICAL DATA: Cough and shortness of breath

EXAM:
CHEST - 2 VIEW

[chest pa]
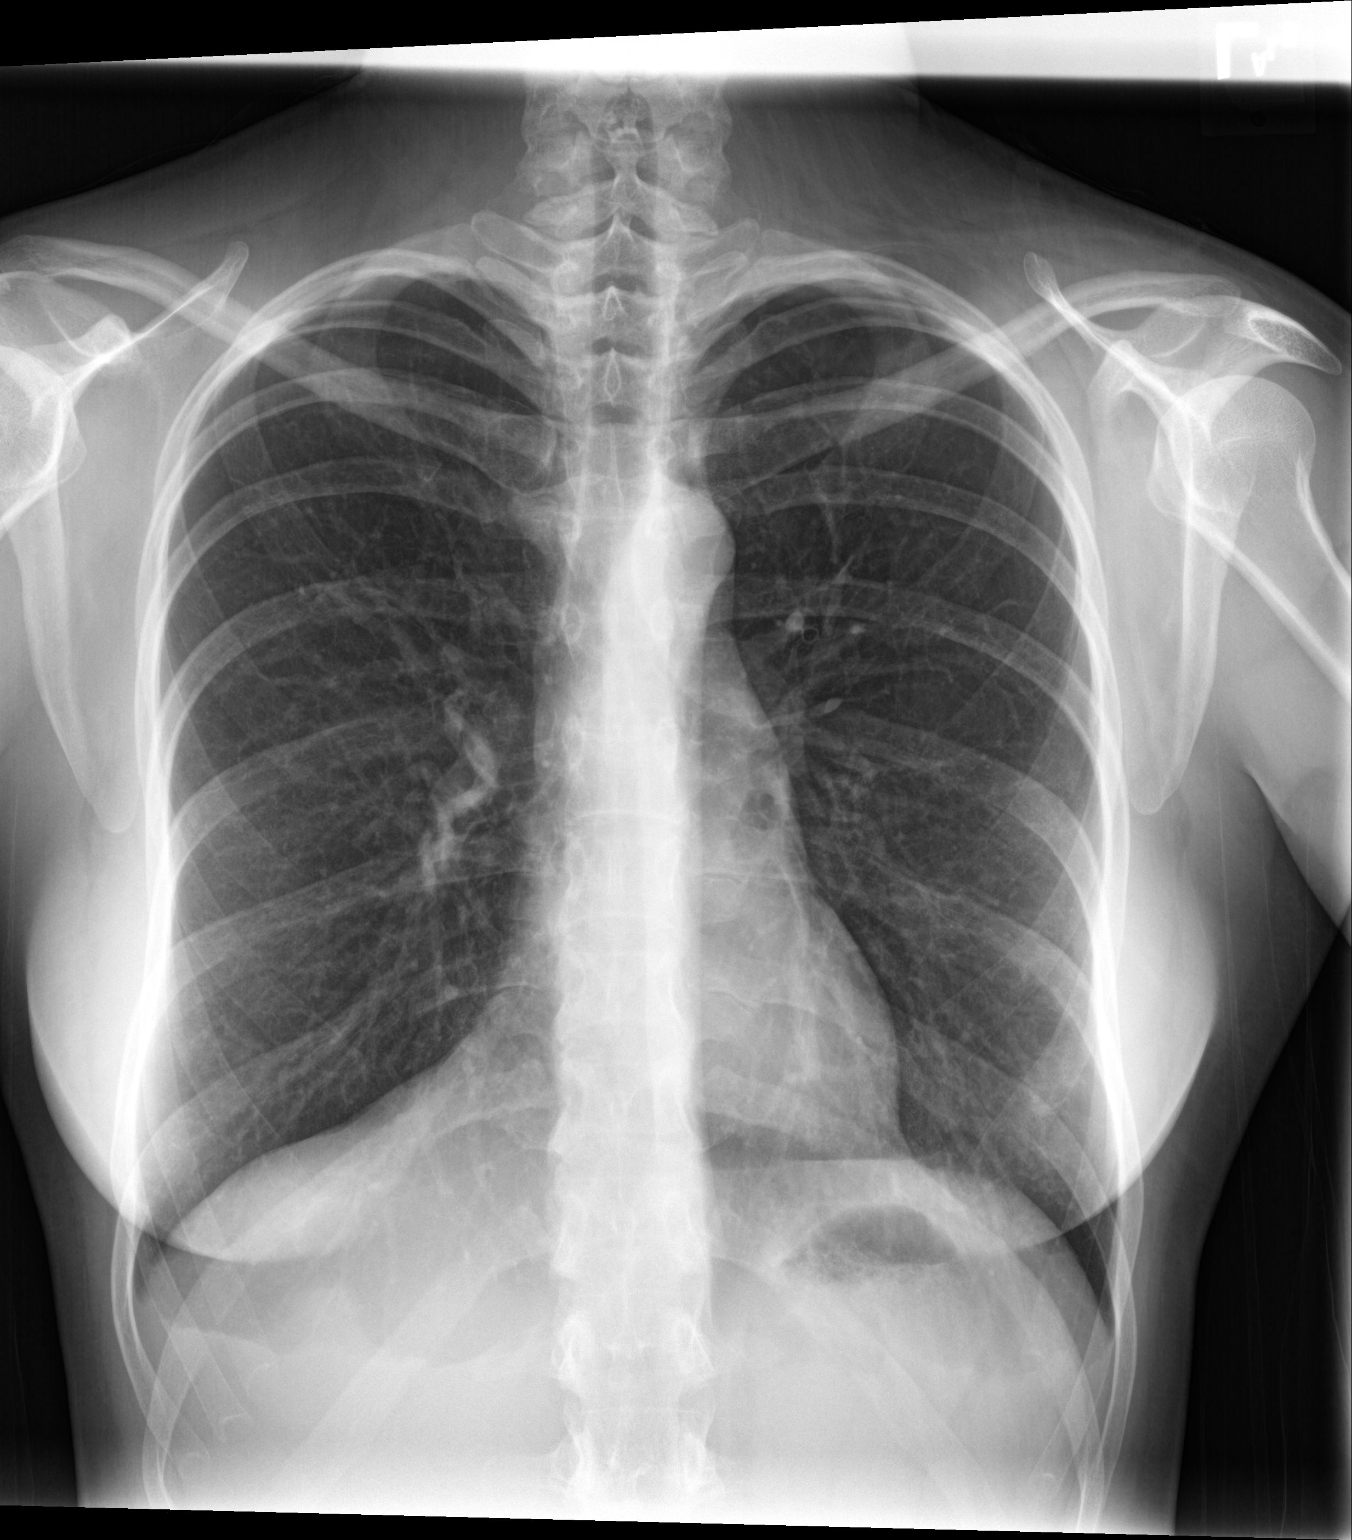

[chest lat]
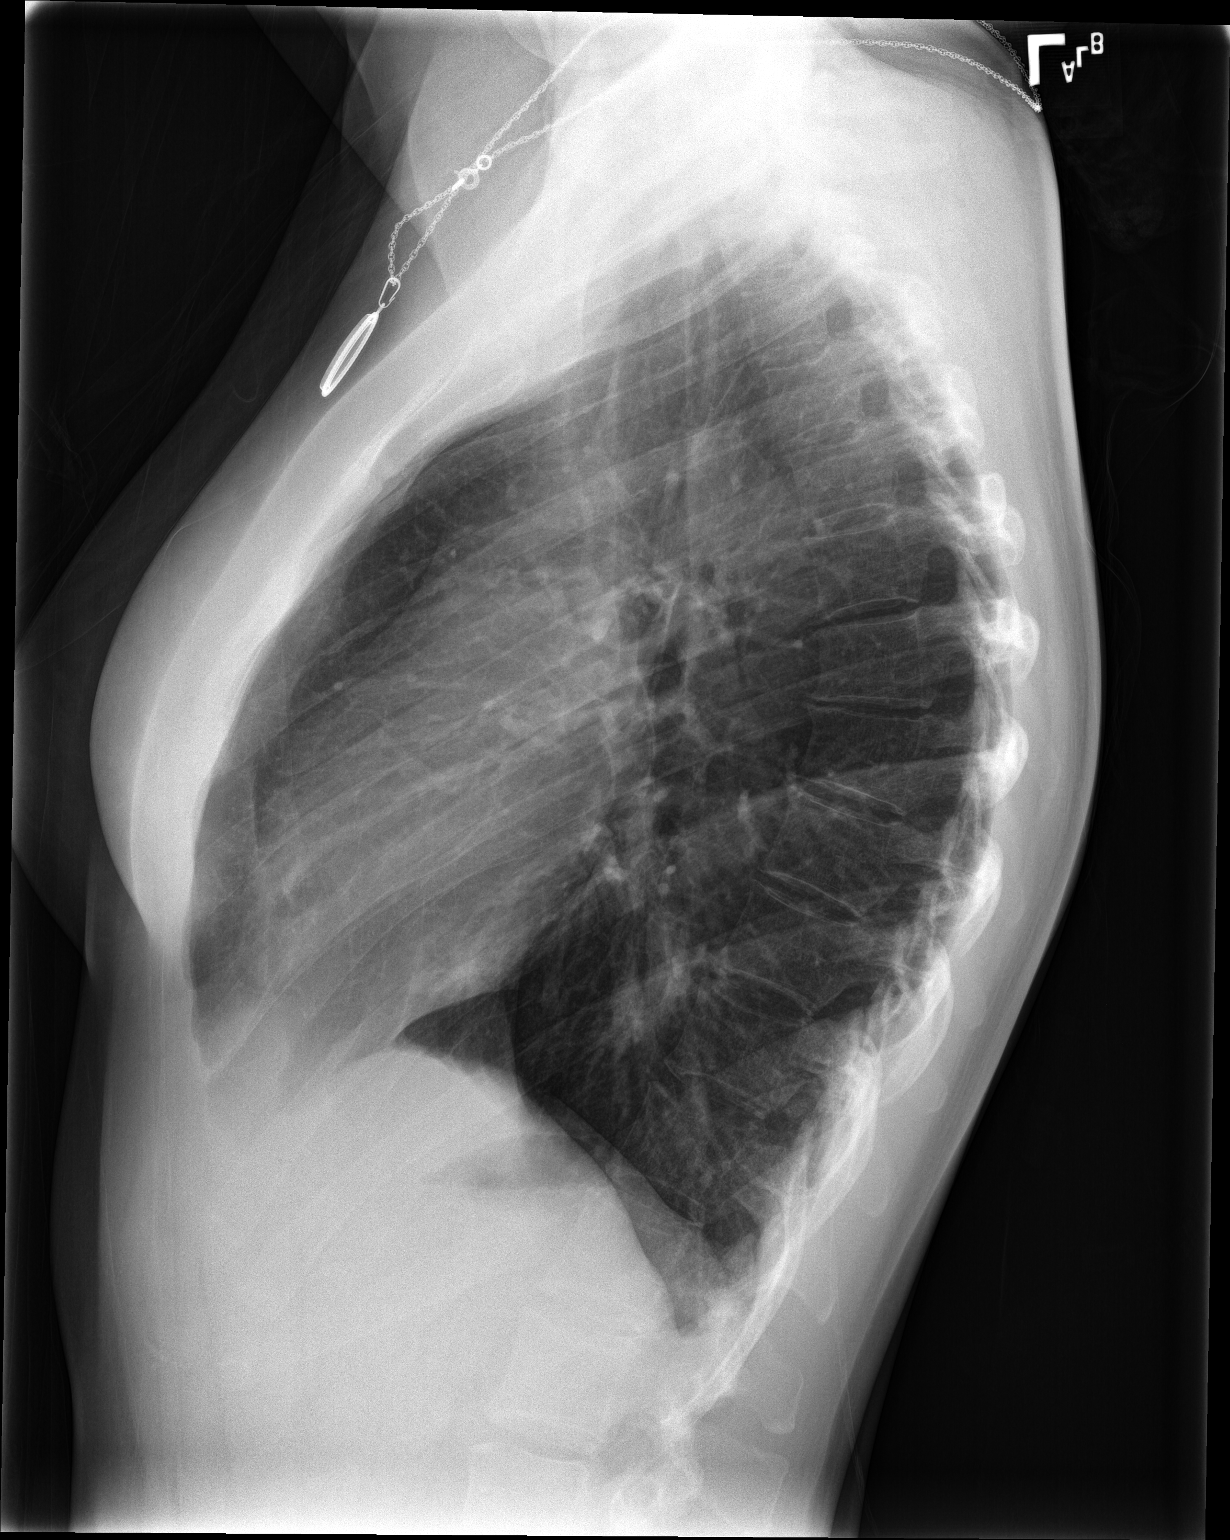

[2 of 2 positions shown; findings below may reference images not displayed]

FINDINGS: Lungs are clear. Heart size and pulmonary vascularity are normal. No
adenopathy. No bone lesions.
IMPRESSION: Lungs clear.  Cardiac silhouette normal.  No adenopathy.

## 2021-03-11 ENCOUNTER — Other Ambulatory Visit: Payer: Self-pay

## 2021-03-11 ENCOUNTER — Ambulatory Visit
Admission: EM | Admit: 2021-03-11 | Discharge: 2021-03-11 | Disposition: A | Discharge status: Home / Self Care | Payer: BC Managed Care – PPO | Attending: Physician Assistant | Admitting: Physician Assistant

## 2021-03-11 ENCOUNTER — Encounter: Payer: Self-pay | Admitting: Emergency Medicine

## 2021-03-11 DIAGNOSIS — R112 Nausea with vomiting, unspecified: Secondary | ICD-10-CM | POA: Diagnosis present

## 2021-03-11 DIAGNOSIS — Z8616 Personal history of COVID-19: Secondary | ICD-10-CM | POA: Diagnosis not present

## 2021-03-11 DIAGNOSIS — Z3202 Encounter for pregnancy test, result negative: Secondary | ICD-10-CM | POA: Insufficient documentation

## 2021-03-11 DIAGNOSIS — R42 Dizziness and giddiness: Secondary | ICD-10-CM | POA: Diagnosis present

## 2021-03-11 DIAGNOSIS — R197 Diarrhea, unspecified: Secondary | ICD-10-CM | POA: Insufficient documentation

## 2021-03-11 LAB — CBC WITH DIFFERENTIAL/PLATELET
Abs Immature Granulocytes: 0.02 10*3/uL (ref 0.00–0.07)
Basophils Absolute: 0 10*3/uL (ref 0.0–0.1)
Basophils Relative: 0 %
Eosinophils Absolute: 0 10*3/uL (ref 0.0–0.5)
Eosinophils Relative: 0 %
HCT: 40.6 % (ref 36.0–46.0)
Hemoglobin: 13.8 g/dL (ref 12.0–15.0)
Immature Granulocytes: 0 %
Lymphocytes Relative: 23 %
Lymphs Abs: 1.6 10*3/uL (ref 0.7–4.0)
MCH: 30.7 pg (ref 26.0–34.0)
MCHC: 34 g/dL (ref 30.0–36.0)
MCV: 90.2 fL (ref 80.0–100.0)
Monocytes Absolute: 0.6 10*3/uL (ref 0.1–1.0)
Monocytes Relative: 8 %
Neutro Abs: 4.8 10*3/uL (ref 1.7–7.7)
Neutrophils Relative %: 69 %
Platelets: 226 10*3/uL (ref 150–400)
RBC: 4.5 MIL/uL (ref 3.87–5.11)
RDW: 12.3 % (ref 11.5–15.5)
WBC: 7 10*3/uL (ref 4.0–10.5)
nRBC: 0 % (ref 0.0–0.2)

## 2021-03-11 LAB — HCG, QUANTITATIVE, PREGNANCY: hCG, Beta Chain, Quant, S: <1 m[IU]/mL (ref <5)

## 2021-03-11 LAB — URINALYSIS, COMPLETE (UACMP) WITH MICROSCOPIC
Glucose, UA: NEGATIVE mg/dL
Ketones, ur: >160 mg/dL — AB
Leukocytes,Ua: NEGATIVE
Nitrite: NEGATIVE
Protein, ur: NEGATIVE mg/dL
Specific Gravity, Urine: 1.025 (ref 1.005–1.030)
pH: 6 (ref 5.0–8.0)

## 2021-03-11 LAB — COMPREHENSIVE METABOLIC PANEL
ALT: 13 U/L (ref 0–44)
AST: 16 U/L (ref 15–41)
Albumin: 4.2 g/dL (ref 3.5–5.0)
Alkaline Phosphatase: 46 U/L (ref 38–126)
Anion gap: 8 (ref 5–15)
BUN: 7 mg/dL (ref 6–20)
CO2: 22 mmol/L (ref 22–32)
Calcium: 8.9 mg/dL (ref 8.9–10.3)
Chloride: 105 mmol/L (ref 98–111)
Creatinine, Ser: 0.58 mg/dL (ref 0.44–1.00)
GFR, Estimated: >60 mL/min (ref >60)
Glucose, Bld: 96 mg/dL (ref 70–99)
Potassium: 3.3 mmol/L — ABNORMAL LOW (ref 3.5–5.1)
Sodium: 135 mmol/L (ref 135–145)
Total Bilirubin: 1.7 mg/dL — ABNORMAL HIGH (ref 0.3–1.2)
Total Protein: 7.7 g/dL (ref 6.5–8.1)

## 2021-03-11 LAB — PREGNANCY, URINE: Preg Test, Ur: NEGATIVE

## 2021-03-11 MED ORDER — MECLIZINE HCL 25 MG PO TABS
25.0000 mg | ORAL_TABLET | Freq: Three times a day (TID) | ORAL | 0 refills | Status: AC | PRN
Start: 2021-03-11 — End: ?

## 2021-03-11 MED ORDER — ONDANSETRON HCL 4 MG PO TABS: 4.0000 mg | ORAL_TABLET | Freq: Four times a day (QID) | ORAL | 0 refills | Status: AC

## 2021-03-11 NOTE — Discharge Instructions (Addendum)
Discussed with patient and husband that we will draw some labs today but patient may need to go to the emergency room for further evaluation and testing. Patient needs to follow-up with a cardiologist to potentially reevaluate heart arrhythmias. All your test was negative while you are here Recommended that the patient keep a diary of when the symptoms occur Patient will call her PCP in the a.m. to see if she can have an outpatient CT scan completed Educated patient to make sure she stays hydrated well plenty of fluids and take the meclizine as needed

## 2021-03-11 NOTE — ED Triage Notes (Signed)
Pt c/o dizziness, pre syncope, vomiting, "room spinning", numbness on the right side of her body. Started christmas eve. She denies having vertigo in the past.

## 2021-03-11 NOTE — ED Provider Notes (Signed)
[facility]    CSN: [id number] Arrival date & time: 03/11/21  1222      History   Chief Complaint Chief Complaint  Patient presents with   Emesis   Near Syncope    HPI Julie Christian is a 36 y.o. female.   Patient has brought in by her husband and for increased dizziness over the past month with emesis..  Patient states that approximately 2 years ago she had COVID and started having cardiac issues.  She was seen by cardiologist and placed on Holter monitor but results were negative for this.  Patient also states that she has had a history of migraines with auras and this is not the same symptoms.  Patient did take a pregnancy test at home and it was negative.  They are concerned because the symptoms are getting more frequent and she is not able to function throughout the day because of this.  Patient denies any chest pain no shortness of breath.  She is alert and oriented and has been eating and drinking without complications.  Patient states that her last menstrual period was irregular last month and she has not had 1 since.   Past Medical History:  Diagnosis Date   Cystitis    chronic, Dr. Jacqlyn Larsen   Herpes, genital 06/2010   pos culture   LGSIL on Pap smear of cervix 2014   Migraines     Patient Active Problem List   Diagnosis Date Noted   LGSIL on Pap smear of cervix 02/06/2019   ASCUS with positive high risk HPV cervical 01/28/2018    Past Surgical History:  Procedure Laterality Date   HIP SURGERY Left    MOUTH SURGERY      OB History     Gravida  0   Para  0   Term  0   Preterm  0   AB  0   Living  0      SAB  0   IAB  0   Ectopic  0   Multiple  0   Live Births  0            Home Medications    Prior to Admission medications   Medication Sig Start Date End Date Taking? Authorizing Provider  acyclovir (ZOVIRAX) 400 MG tablet Take 2 tablets (800 mg total) by mouth 2 (two) times daily. For 5 days prn sx 11/18/18  Yes  Copland, Deirdre Evener, PA-C  JUNEL 1.5/30 1.5-30 MG-MCG tablet Take 1 tablet by mouth daily. [id number]  Yes Copland, Deirdre Evener, PA-C  meclizine (ANTIVERT) 25 MG tablet Take 1 tablet (25 mg total) by mouth 3 (three) times daily as needed for dizziness. 03/11/21  Yes Marney Setting, NP  albuterol (VENTOLIN HFA) 108 (90 Base) MCG/ACT inhaler Inhale 1-2 puffs into the lungs every 6 (six) hours as needed for wheezing or shortness of breath. 04/01/19   Coral Spikes, DO  predniSONE (DELTASONE) 50 MG tablet 1 tablet daily x 5 days 04/01/19   Coral Spikes, DO    Family History Family History  Problem Relation Age of Onset   Other Father        Atherosclerosis   Asthma Father    Liver cancer Maternal Grandfather 75       Malignant    Social History Social History   Tobacco Use   Smoking status: Never   Smokeless tobacco: Never  Vaping Use   Vaping Use: Never used  Substance  Use Topics   Alcohol use: Yes   Drug use: No     Allergies   Vicodin [hydrocodone-acetaminophen]   Review of Systems Review of Systems  Constitutional:  Negative for fever.  HENT: Negative.  Negative for congestion, postnasal drip, rhinorrhea, sinus pressure, sinus pain, sneezing and sore throat.   Eyes: Negative.   Respiratory: Negative.  Negative for cough and shortness of breath.   Cardiovascular: Negative.  Negative for chest pain, palpitations and leg swelling.  Gastrointestinal:  Positive for nausea and vomiting.  Endocrine: Negative.   Genitourinary: Negative.   Musculoskeletal: Negative.   Skin:  Positive for pallor.  Neurological:  Positive for dizziness, weakness and headaches.    Physical Exam Triage Vital Signs ED Triage Vitals  Enc Vitals Group     BP 03/11/21 1233 111/87     Pulse Rate 03/11/21 1233 (!) 116     Resp 03/11/21 1233 18     Temp 03/11/21 1233 98.1 F (36.7 C)     Temp Source 03/11/21 1233 Oral     SpO2 03/11/21 1233 100 %     Weight 03/11/21 1234 125 lb (56.7 kg)     Height  03/11/21 1234 5\' 3"  (1.6 m)     Head Circumference --      Peak Flow --      Pain Score 03/11/21 1233 0     Pain Loc --      Pain Edu? --      Excl. in [place]? --    No data found.  Updated Vital Signs BP 111/87 (BP Location: Left Arm)    Pulse (!) 116    Temp 98.1 F (36.7 C) (Oral)    Resp 18    Ht 5\' 3"  (1.6 m)    Wt 125 lb (56.7 kg)    LMP 03/04/2021 Comment: pt states she took a negative pregnacy test this morning.   SpO2 100%    BMI 22.14 kg/m   Visual Acuity Right Eye Distance:   Left Eye Distance:   Bilateral Distance:    Right Eye Near:   Left Eye Near:    Bilateral Near:     Physical Exam Constitutional:      Appearance: Normal appearance. She is normal weight.  HENT:     Right Ear: Tympanic membrane normal.     Left Ear: Tympanic membrane normal.     Nose: Nose normal.     Mouth/Throat:     Mouth: Mucous membranes are moist.  Eyes:     Pupils: Pupils are equal, round, and reactive to light.  Cardiovascular:     Rate and Rhythm: Normal rate.     Pulses: Normal pulses.  Pulmonary:     Effort: Pulmonary effort is normal.  Abdominal:     General: Abdomen is flat.     Tenderness: There is no right CVA tenderness or left CVA tenderness.  Musculoskeletal:        General: Normal range of motion.     Cervical back: Normal range of motion.  Skin:    General: Skin is warm.     Capillary Refill: Capillary refill takes less than 2 seconds.  Neurological:     General: No focal deficit present.     Mental Status: She is alert. Mental status is at baseline.     UC Treatments / Results  Labs (all labs ordered are listed, but only abnormal results are displayed) Labs Reviewed  URINALYSIS, COMPLETE (UACMP) WITH MICROSCOPIC - Abnormal;  Notable for the following components:      Result Value   Hgb urine dipstick TRACE (*)    Bilirubin Urine SMALL (*)    Ketones, ur >160 (*)    Bacteria, UA FEW (*)    All other components within normal limits  COMPREHENSIVE  METABOLIC PANEL - Abnormal; Notable for the following components:   Potassium 3.3 (*)    Total Bilirubin 1.7 (*)    All other components within normal limits  CBC WITH DIFFERENTIAL/PLATELET  PREGNANCY, URINE  HCG, QUANTITATIVE, PREGNANCY    EKG   Radiology No results found.  Procedures ED EKG  Date/Time: 03/11/2021 1:39 PM Performed by: Coralyn Mark, NP Authorized by: Shirlee Latch, PA-C   Previous ECG:    Previous ECG:  Unavailable Interpretation:    Interpretation: normal   Rate:    ECG rate assessment: normal   Rhythm:    Rhythm: sinus rhythm   Ectopy:    Ectopy: none   QRS:    QRS axis:  Normal   QRS intervals:  Normal ST segments:    ST segments:  Normal Q waves:    Abnormal Q-waves: not present   (including critical care time)  Medications Ordered in UC Medications - No data to display  Initial Impression / Assessment and Plan / UC Course  I have reviewed the triage vital signs and the nursing notes.  Pertinent labs & imaging results that were available during my care of the patient were reviewed by me and considered in my medical decision making (see chart for details).     Discussed with patient and husband that we will draw some labs today but patient may need to go to the emergency room for further evaluation and testing. Patient needs to follow-up with a cardiologist to potentially reevaluate heart arrhythmias. All your test was negative while you are here Spoke with patient and her husband extensively on if symptoms become worse to go to the emergency room we cannot rule out a mass or something neurological. The symptoms can be more likely just the vertigo with several contributing factors Recommended that the patient keep a diary of when the symptoms occur Patient will call her PCP in the a.m. to see if she can have an outpatient CT scan completed Educated patient to make sure she stays hydrated well plenty of fluids and take the meclizine  as needed Final Clinical Impressions(s) / UC Diagnoses   Final diagnoses:  Dizziness  Nausea vomiting and diarrhea     Discharge Instructions      Discussed with patient and husband that we will draw some labs today but patient may need to go to the emergency room for further evaluation and testing. Patient needs to follow-up with a cardiologist to potentially reevaluate heart arrhythmias. All your test was negative while you are here Recommended that the patient keep a diary of when the symptoms occur Patient will call her PCP in the a.m. to see if she can have an outpatient CT scan completed Educated patient to make sure she stays hydrated well plenty of fluids and take the meclizine as needed      ED Prescriptions     Medication Sig Dispense Auth. Provider   meclizine (ANTIVERT) 25 MG tablet Take 1 tablet (25 mg total) by mouth 3 (three) times daily as needed for dizziness. 30 tablet Coralyn Mark, NP      PDMP not reviewed this encounter.   Coralyn Mark, NP 03/11/21  1451 ° °
